# Patient Record
Sex: Male | Born: 1960
Health system: Southern US, Community
[De-identification: ages and names within clinical notes are randomized; demographics above are authoritative.]

## PROBLEM LIST (undated history)

## (undated) DIAGNOSIS — I5032 Chronic diastolic (congestive) heart failure: Secondary | ICD-10-CM

## (undated) DIAGNOSIS — J9610 Chronic respiratory failure, unspecified whether with hypoxia or hypercapnia: Secondary | ICD-10-CM

## (undated) DIAGNOSIS — G4733 Obstructive sleep apnea (adult) (pediatric): Secondary | ICD-10-CM

## (undated) DIAGNOSIS — J1100 Influenza due to unidentified influenza virus with unspecified type of pneumonia: Secondary | ICD-10-CM

## (undated) DIAGNOSIS — I10 Essential (primary) hypertension: Secondary | ICD-10-CM

## (undated) DIAGNOSIS — E119 Type 2 diabetes mellitus without complications: Secondary | ICD-10-CM

## (undated) DIAGNOSIS — E785 Hyperlipidemia, unspecified: Secondary | ICD-10-CM

## (undated) DIAGNOSIS — M419 Scoliosis, unspecified: Secondary | ICD-10-CM

## (undated) HISTORY — PX: OTHER SURGICAL HISTORY: SHX169

## (undated) HISTORY — DX: Type 2 diabetes mellitus without complications: E11.9

## (undated) HISTORY — DX: Chronic diastolic (congestive) heart failure: I50.32

## (undated) HISTORY — DX: Hyperlipidemia, unspecified: E78.5

---

## 2014-11-08 DIAGNOSIS — J1100 Influenza due to unidentified influenza virus with unspecified type of pneumonia: Secondary | ICD-10-CM

## 2014-11-08 HISTORY — DX: Influenza due to unidentified influenza virus with unspecified type of pneumonia: J11.00

## 2015-01-23 DIAGNOSIS — R0902 Hypoxemia: Secondary | ICD-10-CM | POA: Insufficient documentation

## 2015-01-23 DIAGNOSIS — J811 Chronic pulmonary edema: Secondary | ICD-10-CM | POA: Insufficient documentation

## 2015-01-28 DIAGNOSIS — R609 Edema, unspecified: Secondary | ICD-10-CM | POA: Insufficient documentation

## 2015-01-28 DIAGNOSIS — I119 Hypertensive heart disease without heart failure: Secondary | ICD-10-CM | POA: Insufficient documentation

## 2015-09-08 ENCOUNTER — Encounter (HOSPITAL_BASED_OUTPATIENT_CLINIC_OR_DEPARTMENT_OTHER): Payer: Self-pay | Admitting: Emergency Medicine

## 2015-09-08 ENCOUNTER — Inpatient Hospital Stay (HOSPITAL_BASED_OUTPATIENT_CLINIC_OR_DEPARTMENT_OTHER)
Admission: EM | Admit: 2015-09-08 | Discharge: 2015-09-14 | DRG: 208 | Disposition: A | Discharge status: Home / Self Care | Payer: Medicaid Other | Attending: Internal Medicine | Admitting: Internal Medicine

## 2015-09-08 ENCOUNTER — Emergency Department (HOSPITAL_BASED_OUTPATIENT_CLINIC_OR_DEPARTMENT_OTHER): Payer: Medicaid Other

## 2015-09-08 DIAGNOSIS — J9621 Acute and chronic respiratory failure with hypoxia: Secondary | ICD-10-CM | POA: Diagnosis present

## 2015-09-08 DIAGNOSIS — G9340 Encephalopathy, unspecified: Secondary | ICD-10-CM | POA: Diagnosis present

## 2015-09-08 DIAGNOSIS — I2781 Cor pulmonale (chronic): Secondary | ICD-10-CM | POA: Diagnosis present

## 2015-09-08 DIAGNOSIS — G9341 Metabolic encephalopathy: Secondary | ICD-10-CM | POA: Diagnosis present

## 2015-09-08 DIAGNOSIS — J9622 Acute and chronic respiratory failure with hypercapnia: Secondary | ICD-10-CM | POA: Diagnosis present

## 2015-09-08 DIAGNOSIS — J189 Pneumonia, unspecified organism: Secondary | ICD-10-CM | POA: Diagnosis present

## 2015-09-08 DIAGNOSIS — Z6841 Body Mass Index (BMI) 40.0 and over, adult: Secondary | ICD-10-CM | POA: Diagnosis not present

## 2015-09-08 DIAGNOSIS — R739 Hyperglycemia, unspecified: Secondary | ICD-10-CM | POA: Diagnosis present

## 2015-09-08 DIAGNOSIS — J9811 Atelectasis: Secondary | ICD-10-CM | POA: Diagnosis present

## 2015-09-08 DIAGNOSIS — E876 Hypokalemia: Secondary | ICD-10-CM | POA: Diagnosis present

## 2015-09-08 DIAGNOSIS — J811 Chronic pulmonary edema: Secondary | ICD-10-CM | POA: Diagnosis present

## 2015-09-08 DIAGNOSIS — N179 Acute kidney failure, unspecified: Secondary | ICD-10-CM | POA: Diagnosis present

## 2015-09-08 DIAGNOSIS — I110 Hypertensive heart disease with heart failure: Secondary | ICD-10-CM | POA: Diagnosis present

## 2015-09-08 DIAGNOSIS — E8770 Fluid overload, unspecified: Secondary | ICD-10-CM | POA: Diagnosis present

## 2015-09-08 DIAGNOSIS — E662 Morbid (severe) obesity with alveolar hypoventilation: Secondary | ICD-10-CM | POA: Diagnosis present

## 2015-09-08 DIAGNOSIS — J9601 Acute respiratory failure with hypoxia: Secondary | ICD-10-CM | POA: Diagnosis present

## 2015-09-08 DIAGNOSIS — R0602 Shortness of breath: Secondary | ICD-10-CM

## 2015-09-08 DIAGNOSIS — J9602 Acute respiratory failure with hypercapnia: Secondary | ICD-10-CM

## 2015-09-08 DIAGNOSIS — J441 Chronic obstructive pulmonary disease with (acute) exacerbation: Secondary | ICD-10-CM | POA: Diagnosis present

## 2015-09-08 DIAGNOSIS — I5033 Acute on chronic diastolic (congestive) heart failure: Secondary | ICD-10-CM | POA: Diagnosis present

## 2015-09-08 DIAGNOSIS — I509 Heart failure, unspecified: Secondary | ICD-10-CM

## 2015-09-08 DIAGNOSIS — J9600 Acute respiratory failure, unspecified whether with hypoxia or hypercapnia: Secondary | ICD-10-CM | POA: Diagnosis present

## 2015-09-08 DIAGNOSIS — J9690 Respiratory failure, unspecified, unspecified whether with hypoxia or hypercapnia: Secondary | ICD-10-CM

## 2015-09-08 HISTORY — DX: Essential (primary) hypertension: I10

## 2015-09-08 LAB — COMPREHENSIVE METABOLIC PANEL
ALBUMIN: 3.5 g/dL (ref 3.5–5.0)
ALK PHOS: 65 U/L (ref 38–126)
ALT: 153 U/L — ABNORMAL HIGH (ref 17–63)
AST: 73 U/L — AB (ref 15–41)
Anion gap: 5 (ref 5–15)
BILIRUBIN TOTAL: 0.6 mg/dL (ref 0.3–1.2)
BUN: 21 mg/dL — AB (ref 6–20)
CALCIUM: 8.6 mg/dL — AB (ref 8.9–10.3)
CO2: 36 mmol/L — ABNORMAL HIGH (ref 22–32)
Chloride: 101 mmol/L (ref 101–111)
Creatinine, Ser: 1.08 mg/dL (ref 0.61–1.24)
GFR calc Af Amer: >60 mL/min (ref >60)
GFR calc non Af Amer: >60 mL/min (ref >60)
GLUCOSE: 185 mg/dL — AB (ref 65–99)
Potassium: 3.8 mmol/L (ref 3.5–5.1)
Sodium: 142 mmol/L (ref 135–145)
TOTAL PROTEIN: 6.6 g/dL (ref 6.5–8.1)

## 2015-09-08 LAB — CBC WITH DIFFERENTIAL/PLATELET
BASOS ABS: 0 10*3/uL (ref 0.0–0.1)
BASOS PCT: 0 %
EOS PCT: 1 %
Eosinophils Absolute: 0 10*3/uL (ref 0.0–0.7)
HEMATOCRIT: 50.3 % (ref 39.0–52.0)
Hemoglobin: 15.6 g/dL (ref 13.0–17.0)
LYMPHS ABS: 1.4 10*3/uL (ref 0.7–4.0)
LYMPHS PCT: 17 %
MCH: 30.9 pg (ref 26.0–34.0)
MCHC: 31 g/dL (ref 30.0–36.0)
MCV: 99.6 fL (ref 78.0–100.0)
MONO ABS: 0.6 10*3/uL (ref 0.1–1.0)
MONOS PCT: 8 %
Neutro Abs: 5.9 10*3/uL (ref 1.7–7.7)
Neutrophils Relative %: 75 %
PLATELETS: 278 10*3/uL (ref 150–400)
RBC: 5.05 MIL/uL (ref 4.22–5.81)
RDW: 15.5 % (ref 11.5–15.5)
WBC: 7.9 10*3/uL (ref 4.0–10.5)

## 2015-09-08 LAB — PROTIME-INR
INR: 0.97 (ref 0.00–1.49)
Prothrombin Time: 13.1 seconds (ref 11.6–15.2)

## 2015-09-08 LAB — I-STAT ARTERIAL BLOOD GAS, ED
ACID-BASE EXCESS: 5 mmol/L — AB (ref 0.0–2.0)
Acid-Base Excess: 5 mmol/L — ABNORMAL HIGH (ref 0.0–2.0)
Acid-Base Excess: 9 mmol/L — ABNORMAL HIGH (ref 0.0–2.0)
BICARBONATE: 36.2 meq/L — AB (ref 20.0–24.0)
Bicarbonate: 37.9 mEq/L — ABNORMAL HIGH (ref 20.0–24.0)
Bicarbonate: 33.9 mEq/L — ABNORMAL HIGH (ref 20.0–24.0)
O2 SAT: 91 %
O2 Saturation: 84 %
O2 Saturation: 100 %
PCO2 ART: 97.8 mmHg — AB (ref 35.0–45.0)
PCO2 ART: 47.2 mmHg — AB (ref 35.0–45.0)
PH ART: 7.229 — AB (ref 7.350–7.450)
PH ART: 7.197 — AB (ref 7.350–7.450)
PO2 ART: 79 mmHg — AB (ref 80.0–100.0)
PO2 ART: 217 mmHg — AB (ref 80.0–100.0)
Patient temperature: 98.7
Patient temperature: 36.8
TCO2: 39 mmol/L (ref 0–100)
TCO2: 41 mmol/L (ref 0–100)
TCO2: 35 mmol/L (ref 0–100)
pCO2 arterial: 86.6 mmHg (ref 35.0–45.0)
pH, Arterial: 7.464 — ABNORMAL HIGH (ref 7.350–7.450)
pO2, Arterial: 60 mmHg — ABNORMAL LOW (ref 80.0–100.0)

## 2015-09-08 LAB — URINALYSIS, ROUTINE W REFLEX MICROSCOPIC
Bilirubin Urine: NEGATIVE
Glucose, UA: NEGATIVE mg/dL
Hgb urine dipstick: NEGATIVE
KETONES UR: NEGATIVE mg/dL
LEUKOCYTES UA: NEGATIVE
Nitrite: NEGATIVE
PROTEIN: 30 mg/dL — AB
Specific Gravity, Urine: 1.031 — ABNORMAL HIGH (ref 1.005–1.030)
UROBILINOGEN UA: 1 mg/dL (ref 0.0–1.0)
pH: 6 (ref 5.0–8.0)

## 2015-09-08 LAB — LIPASE, BLOOD: Lipase: 26 U/L (ref 11–51)

## 2015-09-08 LAB — TROPONIN I
TROPONIN I: 0.06 ng/mL — AB (ref <0.031)
Troponin I: 0.08 ng/mL — ABNORMAL HIGH (ref <0.031)

## 2015-09-08 LAB — URINE MICROSCOPIC-ADD ON

## 2015-09-08 LAB — PROCALCITONIN: Procalcitonin: 0.1 ng/mL

## 2015-09-08 LAB — I-STAT CG4 LACTIC ACID, ED: LACTIC ACID, VENOUS: 1.46 mmol/L (ref 0.5–2.0)

## 2015-09-08 LAB — BRAIN NATRIURETIC PEPTIDE: B Natriuretic Peptide: 130.3 pg/mL — ABNORMAL HIGH (ref 0.0–100.0)

## 2015-09-08 LAB — GLUCOSE, CAPILLARY: Glucose-Capillary: 164 mg/dL — ABNORMAL HIGH (ref 65–99)

## 2015-09-08 LAB — MRSA PCR SCREENING: MRSA BY PCR: NEGATIVE

## 2015-09-08 MED ORDER — ETOMIDATE 2 MG/ML IV SOLN
20.0000 mg | Freq: Once | INTRAVENOUS | Status: AC
  Administered 2015-09-08: 20 mg via INTRAVENOUS
  Filled 2015-09-08: qty 10

## 2015-09-08 MED ORDER — SODIUM CHLORIDE 0.9 % IV SOLN: 250.0000 mL | INTRAVENOUS | Status: DC | PRN

## 2015-09-08 MED ORDER — MIDAZOLAM HCL 10 MG/2ML IJ SOLN
INTRAMUSCULAR | Status: AC
  Administered 2015-09-08: 10 mg via INTRAVENOUS
  Filled 2015-09-08: qty 2

## 2015-09-08 MED ORDER — SODIUM CHLORIDE 0.9 % IV SOLN
INTRAVENOUS | Status: DC
  Administered 2015-09-08: 500 mL via INTRAVENOUS

## 2015-09-08 MED ORDER — HYDRALAZINE HCL 20 MG/ML IJ SOLN
10.0000 mg | INTRAMUSCULAR | Status: DC | PRN
Start: 2015-09-08 — End: 2015-09-14

## 2015-09-08 MED ORDER — SODIUM CHLORIDE 0.9 % IV SOLN: INTRAVENOUS | Status: DC

## 2015-09-08 MED ORDER — AZITHROMYCIN 500 MG IV SOLR
INTRAVENOUS | Status: AC
  Filled 2015-09-08: qty 500

## 2015-09-08 MED ORDER — PROPOFOL 1000 MG/100ML IV EMUL
5.0000 ug/kg/min | INTRAVENOUS | Status: DC
  Administered 2015-09-08 – 2015-09-09 (×12): 60 ug/kg/min via INTRAVENOUS
  Administered 2015-09-09: 30 ug/kg/min via INTRAVENOUS
  Administered 2015-09-10 (×4): 60 ug/kg/min via INTRAVENOUS
  Filled 2015-09-08: qty 100
  Filled 2015-09-08: qty 200
  Filled 2015-09-08 (×4): qty 100
  Filled 2015-09-08 (×2): qty 200
  Filled 2015-09-08: qty 100
  Filled 2015-09-08 (×2): qty 200
  Filled 2015-09-08 (×4): qty 100

## 2015-09-08 MED ORDER — INFLUENZA VAC SPLIT QUAD 0.5 ML IM SUSY
0.5000 mL | PREFILLED_SYRINGE | INTRAMUSCULAR | Status: AC
  Administered 2015-09-10: 0.5 mL via INTRAMUSCULAR
  Filled 2015-09-08 (×3): qty 0.5

## 2015-09-08 MED ORDER — IPRATROPIUM-ALBUTEROL 0.5-2.5 (3) MG/3ML IN SOLN
3.0000 mL | Freq: Once | RESPIRATORY_TRACT | Status: AC
  Administered 2015-09-08: 3 mL via RESPIRATORY_TRACT
  Filled 2015-09-08: qty 3

## 2015-09-08 MED ORDER — ALBUTEROL SULFATE (2.5 MG/3ML) 0.083% IN NEBU: 2.5000 mg | INHALATION_SOLUTION | RESPIRATORY_TRACT | Status: DC | PRN

## 2015-09-08 MED ORDER — ENOXAPARIN SODIUM 40 MG/0.4ML ~~LOC~~ SOLN
40.0000 mg | SUBCUTANEOUS | Status: DC
  Administered 2015-09-08: 40 mg via SUBCUTANEOUS
  Filled 2015-09-08 (×2): qty 0.4

## 2015-09-08 MED ORDER — FAMOTIDINE IN NACL 20-0.9 MG/50ML-% IV SOLN
20.0000 mg | Freq: Two times a day (BID) | INTRAVENOUS | Status: DC
  Administered 2015-09-08 – 2015-09-09 (×3): 20 mg via INTRAVENOUS
  Filled 2015-09-08 (×4): qty 50

## 2015-09-08 MED ORDER — ALBUTEROL SULFATE (2.5 MG/3ML) 0.083% IN NEBU
2.5000 mg | INHALATION_SOLUTION | Freq: Once | RESPIRATORY_TRACT | Status: AC
  Administered 2015-09-08: 2.5 mg via RESPIRATORY_TRACT
  Filled 2015-09-08: qty 3

## 2015-09-08 MED ORDER — MIDAZOLAM HCL 10 MG/2ML IJ SOLN
INTRAMUSCULAR | Status: AC
  Filled 2015-09-08: qty 2

## 2015-09-08 MED ORDER — METHYLPREDNISOLONE SODIUM SUCC 125 MG IJ SOLR
125.0000 mg | Freq: Once | INTRAMUSCULAR | Status: AC
  Administered 2015-09-08: 125 mg via INTRAVENOUS
  Filled 2015-09-08: qty 2

## 2015-09-08 MED ORDER — ANTISEPTIC ORAL RINSE SOLUTION (CORINZ)
7.0000 mL | Freq: Four times a day (QID) | OROMUCOSAL | Status: DC
  Administered 2015-09-08 – 2015-09-12 (×14): 7 mL via OROMUCOSAL

## 2015-09-08 MED ORDER — FUROSEMIDE 10 MG/ML IJ SOLN: 40.0000 mg | Freq: Once | INTRAMUSCULAR | Status: DC

## 2015-09-08 MED ORDER — DEXTROSE 5 % IV SOLN
1.0000 g | Freq: Once | INTRAVENOUS | Status: AC
  Administered 2015-09-08: 1 g via INTRAVENOUS

## 2015-09-08 MED ORDER — MIDAZOLAM HCL 5 MG/5ML IJ SOLN
5.0000 mg | Freq: Once | INTRAMUSCULAR | Status: AC
  Administered 2015-09-08: 5 mg via INTRAVENOUS
  Filled 2015-09-08: qty 5

## 2015-09-08 MED ORDER — IPRATROPIUM-ALBUTEROL 0.5-2.5 (3) MG/3ML IN SOLN
3.0000 mL | RESPIRATORY_TRACT | Status: DC
  Administered 2015-09-08: 3 mL via RESPIRATORY_TRACT
  Filled 2015-09-08: qty 3

## 2015-09-08 MED ORDER — FUROSEMIDE 10 MG/ML IJ SOLN
40.0000 mg | Freq: Two times a day (BID) | INTRAMUSCULAR | Status: DC
  Administered 2015-09-08 – 2015-09-10 (×4): 40 mg via INTRAVENOUS
  Filled 2015-09-08 (×7): qty 4

## 2015-09-08 MED ORDER — IOHEXOL 350 MG/ML SOLN
100.0000 mL | Freq: Once | INTRAVENOUS | Status: AC | PRN
  Administered 2015-09-08: 100 mL via INTRAVENOUS

## 2015-09-08 MED ORDER — DEXTROSE 5 % IV SOLN
500.0000 mg | Freq: Once | INTRAVENOUS | Status: AC
  Administered 2015-09-08: 500 mg via INTRAVENOUS

## 2015-09-08 MED ORDER — CHLORHEXIDINE GLUCONATE 0.12% ORAL RINSE (MEDLINE KIT)
15.0000 mL | Freq: Two times a day (BID) | OROMUCOSAL | Status: DC
  Administered 2015-09-08 – 2015-09-12 (×8): 15 mL via OROMUCOSAL

## 2015-09-08 MED ORDER — SODIUM CHLORIDE 0.9 % IV BOLUS (SEPSIS)
500.0000 mL | Freq: Once | INTRAVENOUS | Status: AC
  Administered 2015-09-08: 500 mL via INTRAVENOUS

## 2015-09-08 MED ORDER — PNEUMOCOCCAL VAC POLYVALENT 25 MCG/0.5ML IJ INJ
0.5000 mL | INJECTION | INTRAMUSCULAR | Status: AC
  Administered 2015-09-10: 0.5 mL via INTRAMUSCULAR
  Filled 2015-09-08 (×2): qty 0.5

## 2015-09-08 MED ORDER — MIDAZOLAM HCL 10 MG/2ML IJ SOLN
10.0000 mg | Freq: Once | INTRAMUSCULAR | Status: AC
  Administered 2015-09-08: 10 mg via INTRAVENOUS

## 2015-09-08 MED ORDER — CEFTRIAXONE SODIUM 1 G IJ SOLR
INTRAMUSCULAR | Status: AC
  Filled 2015-09-08: qty 10

## 2015-09-08 MED ORDER — SUCCINYLCHOLINE CHLORIDE 20 MG/ML IJ SOLN
120.0000 mg | Freq: Once | INTRAMUSCULAR | Status: AC
  Administered 2015-09-08: 120 mg via INTRAVENOUS
  Filled 2015-09-08: qty 1

## 2015-09-08 MED ORDER — SODIUM CHLORIDE 0.9 % IV SOLN
INTRAVENOUS | Status: DC
  Administered 2015-09-09: 07:00:00 via INTRAVENOUS
  Administered 2015-09-11: 10 mL/h via INTRAVENOUS

## 2015-09-08 NOTE — ED Notes (Signed)
Pt states he has been sob with peripheral edema for two weeks.  Pt states he has been having intermittent chest pain with it.  Pt has had intermittent cough.  Occasional cough, gray to yellow sputum.  Pt admits to fever yesterday but did not take it.  Pt has sob on exertion.

## 2015-09-08 NOTE — H&P (Signed)
PULMONARY / CRITICAL CARE MEDICINE   Name: Daschel Krasowski MRN: [id number] DOB: 05/24/1961    ADMISSION DATE:  09/08/2015 CONSULTATION DATE:  09/08/2015  REFERRING MD :  [facility] [place] [facility]  CHIEF COMPLAINT:  Respiratory failure  INITIAL PRESENTATION: 54 year old male who presented to [facility] [place] complaining of lower extremity edema and shortness of breath for several weeks. Chest x-ray showed bibasilar opacification. His respiratory status declined and he required intubation. He was transferred to [facility] for ICU admission.  STUDIES:  CTA chest 10/31> no evidence of PE. Atelectasis versus pneumonia bibasilar.  SIGNIFICANT EVENTS: 10/31 admit   HISTORY OF PRESENT ILLNESS:  54 year old male with past medical history of hypertension presented to [facility] [place] [facility] 09/08/2015. Of note he was recently imprisoned, and shortly after he was released (about 6 months ago) he was admitted for pneumonia. He presented complaining of shortness of breath 2 weeks bilateral lower strength any edema 4 weeks. He reports cough, productive for gray/yellow sputum. Subjective fevers yesterday. 10/29 he became short of breath work and was too short of breath to continue. In emergency department he was noted to have moderately increased work of breathing, tachycardia, and +3 bilateral lower shotty pitting edema. He was slightly somnolent initially over the somewhat worsened. CT angiogram of the chest was obtained in order to rule out pulmonary embolism, no PE was identified. However bibasilar atelectasis versus pneumonia was identified and confirmed findings of chest x-ray. In the emergency department he was treated as a COPD exacerbation, and CAP. Unfortunately eventually required intubation due to worsening arterial blood gas sample. He was transferred to [facility] for ICU admission.  PAST MEDICAL HISTORY :   has a past medical history of Hypertension.  has no past surgical history on  file. Prior to Admission medications   Not on File   No Known Allergies  FAMILY HISTORY:  has no family status information on file.  SOCIAL HISTORY:  reports that he has never smoked. He does not have any smokeless tobacco history on file.  REVIEW OF SYSTEMS:  Unable, patient is encephalopathic and intubated.   SUBJECTIVE:   VITAL SIGNS: Temp:  [95.4 F (35.2 C)-98.6 F (37 C)] 98.2 F (36.8 C) (10/31 1406) Pulse Rate:  [77-136] 90 (10/31 1406) Resp:  [12-18] 12 (10/31 1255) BP: (138-180)/(91-114) 161/114 mmHg (10/31 1406) SpO2:  [46 %-100 %] 98 % (10/31 1406) FiO2 (%):  [30 %-100 %] 60 % (10/31 1419) Weight:  [120.203 kg (265 lb)] 120.203 kg (265 lb) (10/31 1135) HEMODYNAMICS:   VENTILATOR SETTINGS: Vent Mode:  [-] PRVC FiO2 (%):  [30 %-100 %] 60 % Set Rate:  [18 bmp] 18 bmp Vt Set:  [600 mL] 600 mL PEEP:  [5 cmH20] 5 cmH20 Plateau Pressure:  [24 cmH20] 24 cmH20 INTAKE / OUTPUT:  Intake/Output Summary (Last 24 hours) at 09/08/15 1504 Last data filed at 09/08/15 1412  Gross per 24 hour  Intake    900 ml  Output    300 ml  Net    600 ml    PHYSICAL EXAMINATION: General:  Morbidly obese male on ventilator Neuro:  Sedated on vent RASS -2 HEENT:  [place]/AT, no appreciable JVD due to neck girth, PERRL Cardiovascular:  RRR, no MRG Lungs:  Coarse bilateral breath sounds Abdomen:  Obese, soft, non-distended Musculoskeletal:  No acute deformity Skin:  Grossly intact  LABS:  CBC  Recent Labs Lab 09/08/15 0945  WBC 7.9  HGB 15.6  HCT 50.3  PLT  278   Coag's  Recent Labs Lab 09/08/15 0945  INR 0.97   BMET  Recent Labs Lab 09/08/15 0945  NA 142  K 3.8  CL 101  CO2 36*  BUN 21*  CREATININE 1.08  GLUCOSE 185*   Electrolytes  Recent Labs Lab 09/08/15 0945  CALCIUM 8.6*   Sepsis Markers  Recent Labs Lab 09/08/15 0944 09/08/15 1021  LATICACIDVEN <0.30* 1.46   ABG  Recent Labs Lab 09/08/15 0949 09/08/15 1206 09/08/15 1409  PHART  7.229* 7.197* 7.464*  PCO2ART 86.6* 97.8* 47.2*  PO2ART 60.0* 79.0* 217.0*   Liver Enzymes  Recent Labs Lab 09/08/15 0945  AST 73*  ALT 153*  ALKPHOS 65  BILITOT 0.6  ALBUMIN 3.5   Cardiac Enzymes  Recent Labs Lab 09/08/15 0945  TROPONINI 0.08*   Glucose No results for input(s): GLUCAP in the last 168 hours.  Imaging Ct Angio Chest Pe W/cm &/or Wo Cm  09/08/2015  CLINICAL DATA:  Shortness of breath, cough, extremity edema, hypoxia and chest pain. EXAM: CT ANGIOGRAPHY CHEST WITH CONTRAST TECHNIQUE: Multidetector CT imaging of the chest was performed using the standard protocol during bolus administration of intravenous contrast. Multiplanar CT image reconstructions and MIPs were obtained to evaluate the vascular anatomy. CONTRAST:  100mL OMNIPAQUE IOHEXOL 350 MG/ML SOLN COMPARISON:  Chest x-ray earlier today. FINDINGS: The pulmonary arteries are adequately opacified. There is no evidence of pulmonary embolism. The thoracic aorta is normal in caliber and shows no evidence of aneurysmal disease or dissection. The heart size is at the upper limits of normal. There is suggestion of potential subtle calcified plaque in the distribution of the LAD. No pleural or pericardial fluid identified. Both posterior lower lobes demonstrates streaky opacities likely consistent with atelectasis. Component of pneumonia would be difficult to exclude. No evidence of pulmonary edema, nodule, airway obstruction or pneumothorax. No enlarged lymph nodes are seen. Visualized upper abdominal structures are unremarkable. Bony structures show a severe leftward convex scoliosis of the upper thoracic spine and compensatory rightward curvature in the lower thoracic and upper lumbar spine. Review of the MIP images confirms the above findings. IMPRESSION: 1. No evidence of pulmonary embolism. 2. Prominent atelectasis in both lower lobes. Component of pneumonia would be difficult to exclude. 3. Suggestion of subtle  calcified plaque in the distribution of the left anterior descending coronary artery. 4. Severe scoliosis. Electronically Signed   By: Irish LackGlenn  Yamagata M.D.   On: 09/08/2015 12:03   Dg Chest Port 1 View  09/08/2015  CLINICAL DATA:  Post intubation EXAM: PORTABLE CHEST 1 VIEW COMPARISON:  CT of the chest same day FINDINGS: Cardiomegaly. Dextroscoliosis lower thoracic and upper lumbar spine. Central vascular congestion without pulmonary edema. Bilateral basilar atelectasis or infiltrate. Endotracheal tube in place with tip 3. 5 cm above the carina. No pneumothorax. IMPRESSION: Endotracheal tube in place. No pneumothorax. Bilateral basilar atelectasis or infiltrate. Electronically Signed   By: Natasha MeadLiviu  Pop M.D.   On: 09/08/2015 13:27   Dg Chest Port 1 View  09/08/2015  CLINICAL DATA:  Cough and shortness of breath, 2-3 weeks duration. EXAM: PORTABLE CHEST 1 VIEW COMPARISON:  None. FINDINGS: There is thoracic deformity related to spinal curvature. Heart size is normal. Mediastinal shadows are normal allowing for the spinal curvature. There is patchy infiltrate in both lung bases, left worse than right, consistent with basilar pneumonia. No dense consolidation or lobar collapse. No effusion. IMPRESSION: Thoracic deformity related to spinal curvature. Basilar pneumonia left more than right. Electronically Signed  By: Paulina Fusi M.D.   On: 09/08/2015 10:15     ASSESSMENT / PLAN:  PULMONARY OETT 10/31 >>> A: Acute on chronic hypercarbic/hypoxemic respiratory failure Suspect decompensated OHS ?Pulmonary edema  P:   Full vent support Diurese as tolerated Repeat ABG  VAP bundle SBT in AM PRN nebs  CARDIOVASCULAR A:  HTN > has been unable to get antiHTN meds for some time.  P:  Telemetry monitoring PRN hydralazine Echo BNP Lasix as tolerated. Will start  BID Strict I/O  RENAL A:   Hypervolemia   P:   KVO IVF Diurese as above Follow Bmet Correct electrolytes as  indicated  GASTROINTESTINAL A:   No acute issues  P:   NPO Pepcid for SUP  HEMATOLOGIC A:   VTE ppx  P:  Enoxaparin for VTE ppx Follow CBC  INFECTIOUS A:   ? CAP > doubt in absence of leukocytosis, fevers. Volume overloaded on exam.   P:   BCx2 10/31 >>> Monitor off ABX Follow WBC and fever curve  ENDOCRINE A:   Hyperglycemia without history of DM  P:   CBG monitoring and SSI  NEUROLOGIC A:   Acute metabolic encephalopathy in setting hypercarbia/hypoxemia.   P:   RASS goal: -1 Propofol infusion titrated to RASS goal WUA in AM   FAMILY  - Updates: Spoke with sister Will on 10/31 PM  - Inter-disciplinary family meet or Palliative Care meeting due by:  11/6  APP Critical Care time: 45 mins  Joneen Roach, AGACNP-BC [facility] Pager [phone] or [phone]  09/08/2015 3:57 PM  Attending:  I have seen and examined the patient with nurse practitioner/resident and agree with the note above.   We were unable to get a history as Mr. Apps was intubated in [facility] On my exam he is sedated on the vent, he has scant crackles in the lower lobes and he has vent supported breaths Legs with significant edema bilaterally, CV exam: tachy, regular, distant heart sounds, cannot appreciate JVD  CT images personally reviewed> there is mostly atelectasis in the lower lobes, no frank infiltrate  Acute on chronic hypercapnic respiratory failure> suspect that this is decompensated cor pulmonale in the setting of obesity hypoventilation syndrome and decompensated CHF (EF unknown).  Do not think he has pneumonia as no fever, no leukocytosis, no respiratory secretions. Plan full vent support Diurese Echo Hold antibiotics Hopefully extubate tomorrow  My cc time 40 minutes  Heber Gratz, MD [facility] [facility] Pager: [phone] Cell: [phone] After 3pm or if no response, call [phone]

## 2015-09-08 NOTE — ED Notes (Signed)
Rt called to room to asses pt. Pt labored SpO2 found to be 44%.  Pt alert. MD called to room .

## 2015-09-08 NOTE — ED Provider Notes (Signed)
CSN: [id number]     Arrival date & time 09/08/15  0908 History   First MD Initiated Contact with Patient 09/08/15 0919     Chief Complaint  Patient presents with  . Shortness of Breath     (Consider location/radiation/quality/duration/timing/severity/associated sxs/prior Treatment) HPI Patient reports that he has been developing shortness of breath for about 2 weeks. He notes swelling of his lower extremities for about 4 weeks. He states now he has been getting chest pain over the past couple of days that is in his upper and central chest. He reports he has had some coughing with it and there has been a gray or yellow sputum associated. He believes he had a fever yesterday but he does not know how high. He reports as of 2 days ago he became too short of breath on exertion to continue his job. The patient reports that he did have an episode whereby he was hospitalized and diagnosed with pneumonia just after he got out of prison. He states that was over 6 months ago. He denies knowledge of specific heart disease or lung disease. He reports he is supposed to be on blood pressure medications but he has been out of them for several months. He denies tobacco use with no smoking history. He denies any alcohol or drug use history. Past Medical History  Diagnosis Date  . Hypertension    History reviewed. No pertinent past surgical history. History reviewed. No pertinent family history. Social History  Substance Use Topics  . Smoking status: Never Smoker   . Smokeless tobacco: None  . Alcohol Use: None    Review of Systems  10 Systems reviewed and are negative for acute change except as noted in the HPI.   Allergies  Review of patient's allergies indicates no known allergies.  Home Medications   Prior to Admission medications   Not on File   BP 133/78 mmHg  Pulse 63  Temp(Src) 97.5 F (36.4 C) (Core (Comment))  Resp 18  Ht 5\' 9"  (1.753 m)  Wt 282 lb 13.6 oz (128.3 kg)  BMI 41.75  kg/m2  SpO2 96% Physical Exam  Constitutional:  Patient has moderate increased work of breathing. He is speaking in full short sentences. Patient has central obesity.  HENT:  Head: Normocephalic and atraumatic.  Mouth/Throat: Oropharynx is clear and moist.  Eyes: EOM are normal. Pupils are equal, round, and reactive to light.  Neck: Neck supple.  Cardiovascular:  Patient has borderline tachycardia. There is sinus rhythm on the monitor 105. I do not appreciate any significant murmur, gallop or rub.  Pulmonary/Chest:  Moderate increased work of breathing. Poor air flow to posterior auscultation. Occasional fine wheeze.  Abdominal:  Abdomen is firm and distended. Patient does not endorse tenderness.  Musculoskeletal:  Bilateral lower extremities 3+ pitting edema. Symmetric. No open wounds or ulcerations. Significant oncymycosis. Thinning of the skin of the lower legs suggestive of chronic venous stasis.  Neurological:  Patient is slightly somnolent but is answering all questions appropriately. He follows commands appropriately. No localizing motor dysfunction.  Skin: Skin is warm and dry.    ED Course  .Intubation Date/Time: 09/09/2015 8:24 AM Performed by: Arby BarrettePFEIFFER, Ailee Pates Authorized by: Arby BarrettePFEIFFER, Kennon Encinas Consent: Verbal consent obtained. Consent given by: patient Indications: respiratory failure Intubation method: video-assisted Patient status: paralyzed (RSI) Preoxygenation: BVM Sedatives: etomidate Paralytic: succinylcholine Laryngoscope size: Mac 4 Tube size: 7.5 mm Tube type: cuffed Number of attempts: 1 Post-procedure assessment: CO2 detector and chest rise Breath sounds: equal Cuff  inflated: yes Chest x-ray interpreted by radiologist. Chest x-ray findings: endotracheal tube in appropriate position Patient tolerance: Patient tolerated the procedure well with no immediate complications Comments: Uncomplicated intubation with first attempt. No desaturation during  intubation.   (including critical care time) CRITICAL CARE Performed by: Arby Barrette   Total critical care time: 60 minutes  Critical care time was exclusive of separately billable procedures and treating other patients.  Critical care was necessary to treat or prevent imminent or life-threatening deterioration.  Critical care was time spent personally by me on the following activities: development of treatment plan with patient and/or surrogate as well as nursing, discussions with consultants, evaluation of patient's response to treatment, examination of patient, obtaining history from patient or surrogate, ordering and performing treatments and interventions, ordering and review of laboratory studies, ordering and review of radiographic studies, pulse oximetry and re-evaluation of patient's condition. Labs Review Labs Reviewed  COMPREHENSIVE METABOLIC PANEL - Abnormal; Notable for the following:    CO2 36 (*)    Glucose, Bld 185 (*)    BUN 21 (*)    Calcium 8.6 (*)    AST 73 (*)    ALT 153 (*)    All other components within normal limits  BRAIN NATRIURETIC PEPTIDE - Abnormal; Notable for the following:    B Natriuretic Peptide 130.3 (*)    All other components within normal limits  TROPONIN I - Abnormal; Notable for the following:    Troponin I 0.08 (*)    All other components within normal limits  URINALYSIS, ROUTINE W REFLEX MICROSCOPIC (NOT AT [facility]) - Abnormal; Notable for the following:    Specific Gravity, Urine 1.031 (*)    Protein, ur 30 (*)    All other components within normal limits  URINE MICROSCOPIC-ADD ON - Abnormal; Notable for the following:    Squamous Epithelial / LPF FEW (*)    Bacteria, UA FEW (*)    All other components within normal limits  TROPONIN I - Abnormal; Notable for the following:    Troponin I 0.06 (*)    All other components within normal limits  GLUCOSE, CAPILLARY - Abnormal; Notable for the following:    Glucose-Capillary 164 (*)     All other components within normal limits  BASIC METABOLIC PANEL - Abnormal; Notable for the following:    Potassium 3.3 (*)    Glucose, Bld 158 (*)    Calcium 8.5 (*)    All other components within normal limits  BLOOD GAS, ARTERIAL - Abnormal; Notable for the following:    pH, Arterial 7.547 (*)    pO2, Arterial 61.7 (*)    Bicarbonate 32.4 (*)    Acid-Base Excess 9.1 (*)    All other components within normal limits  I-STAT ARTERIAL BLOOD GAS, ED - Abnormal; Notable for the following:    pH, Arterial 7.229 (*)    pCO2 arterial 86.6 (*)    pO2, Arterial 60.0 (*)    Bicarbonate 36.2 (*)    Acid-Base Excess 5.0 (*)    All other components within normal limits  I-STAT CG4 LACTIC ACID, ED - Abnormal; Notable for the following:    Lactic Acid, Venous <0.30 (*)    All other components within normal limits  I-STAT ARTERIAL BLOOD GAS, ED - Abnormal; Notable for the following:    pH, Arterial 7.197 (*)    pCO2 arterial 97.8 (*)    pO2, Arterial 79.0 (*)    Bicarbonate 37.9 (*)    Acid-Base Excess  5.0 (*)    All other components within normal limits  I-STAT ARTERIAL BLOOD GAS, ED - Abnormal; Notable for the following:    pH, Arterial 7.464 (*)    pCO2 arterial 47.2 (*)    pO2, Arterial 217.0 (*)    Bicarbonate 33.9 (*)    Acid-Base Excess 9.0 (*)    All other components within normal limits  CULTURE, BLOOD (ROUTINE X 2)  CULTURE, BLOOD (ROUTINE X 2)  MRSA PCR SCREENING  LIPASE, BLOOD  CBC WITH DIFFERENTIAL/PLATELET  PROTIME-INR  TROPONIN I  TROPONIN I  PROCALCITONIN  CBC  MAGNESIUM  PHOSPHORUS  I-STAT CG4 LACTIC ACID, ED    Imaging Review Ct Angio Chest Pe W/cm &/or Wo Cm  09/08/2015  CLINICAL DATA:  Shortness of breath, cough, extremity edema, hypoxia and chest pain. EXAM: CT ANGIOGRAPHY CHEST WITH CONTRAST TECHNIQUE: Multidetector CT imaging of the chest was performed using the standard protocol during bolus administration of intravenous contrast. Multiplanar CT image  reconstructions and MIPs were obtained to evaluate the vascular anatomy. CONTRAST:  OMNIPAQUE IOHEXOL 350 MG/ML SOLN COMPARISON:  Chest x-ray earlier today. FINDINGS: The pulmonary arteries are adequately opacified. There is no evidence of pulmonary embolism. The thoracic aorta is normal in caliber and shows no evidence of aneurysmal disease or dissection. The heart size is at the upper limits of normal. There is suggestion of potential subtle calcified plaque in the distribution of the LAD. No pleural or pericardial fluid identified. Both posterior lower lobes demonstrates streaky opacities likely consistent with atelectasis. Component of pneumonia would be difficult to exclude. No evidence of pulmonary edema, nodule, airway obstruction or pneumothorax. No enlarged lymph nodes are seen. Visualized upper abdominal structures are unremarkable. Bony structures show a severe leftward convex scoliosis of the upper thoracic spine and compensatory rightward curvature in the lower thoracic and upper lumbar spine. Review of the MIP images confirms the above findings. IMPRESSION: 1. No evidence of pulmonary embolism. 2. Prominent atelectasis in both lower lobes. Component of pneumonia would be difficult to exclude. 3. Suggestion of subtle calcified plaque in the distribution of the left anterior descending coronary artery. 4. Severe scoliosis. Electronically Signed   By: Irish Lack M.D.   On: 09/08/2015 12:03   Dg Chest Port 1 View  09/09/2015  CLINICAL DATA:  Respiratory failure. EXAM: PORTABLE CHEST 1 VIEW COMPARISON:  09/08/2015. FINDINGS: Endotracheal tube in stable position. Interval placement of NG tube, its tip appears to be below the left hemidiaphragm. The lower left hemithorax is not imaged. Cardiomegaly bilateral pulmonary infiltrates suggesting congestive heart failure. Bilateral pneumonia cannot be excluded. No prominent pleural effusion. No pneumothorax. IMPRESSION: 1. Placement NG tube, its tip  appears to be below the left hemidiaphragm. However the lower left hemithorax is not imaged. Endotracheal tube in stable position. 2. Cardiomegaly with bilateral pulmonary infiltrates suggest congestive heart failure. Bilateral pneumonia cannot be excluded. Electronically Signed   By: Maisie Fus  Register   On: 09/09/2015 07:09   Dg Chest Port 1 View  09/08/2015  CLINICAL DATA:  Post intubation EXAM: PORTABLE CHEST 1 VIEW COMPARISON:  CT of the chest same day FINDINGS: Cardiomegaly. Dextroscoliosis lower thoracic and upper lumbar spine. Central vascular congestion without pulmonary edema. Bilateral basilar atelectasis or infiltrate. Endotracheal tube in place with tip 3. 5 cm above the carina. No pneumothorax. IMPRESSION: Endotracheal tube in place. No pneumothorax. Bilateral basilar atelectasis or infiltrate. Electronically Signed   By: Natasha Mead M.D.   On: 09/08/2015 13:27   Dg Chest  Port 1 View  09/08/2015  CLINICAL DATA:  Cough and shortness of breath, 2-3 weeks duration. EXAM: PORTABLE CHEST 1 VIEW COMPARISON:  None. FINDINGS: There is thoracic deformity related to spinal curvature. Heart size is normal. Mediastinal shadows are normal allowing for the spinal curvature. There is patchy infiltrate in both lung bases, left worse than right, consistent with basilar pneumonia. No dense consolidation or lobar collapse. No effusion. IMPRESSION: Thoracic deformity related to spinal curvature. Basilar pneumonia left more than right. Electronically Signed   By: Paulina Fusi M.D.   On: 09/08/2015 10:15   I have personally reviewed and evaluated these images and lab results as part of my medical decision-making.   EKG Interpretation   Date/Time:  Monday September 08 2015 09:28:53 EDT Ventricular Rate:  107 PR Interval:  144 QRS Duration: 88 QT Interval:  366 QTC Calculation: 488 R Axis:   73 Text Interpretation:  Sinus tachycardia Biatrial enlargement Abnormal ECG  agree. no STEMI Confirmed by Donnald Garre,  MD, Lebron Conners [phone]) on 09/08/2015  9:38:20 AM     Recheck 12:13 patient continues to respond to verbal interaction. He awakens to light touch if sleeping. Repeat ABG however shows decline in patient's respiratory status. CT does not show pulmonary embolus. MDM   Final diagnoses:  Acute respiratory failure with hypoxia and hypercapnia (HCC)   Patient presents describing incremental shortness of breath and lower extremity swelling. He noticed significant worsening over the past 3 days. Patient's ABG indicate significant hypercapnic respiratory failure. The patient was placed on BiPAP. Repeat ABG did not show improvement. The patient did remain verbally interactive and was advised of necessity for intubation. He and his family member were agreeable understanding the nature of his decompensating respiratory failure. He appears to have a combination of hypoventilation syndrome with obesity and hypercapnic\hypoxic respiratory failure. Intubation was uncomplicated and patient was admitted to ICU for further at an upright intensivist.    Arby Barrette, MD 09/09/15 [phone]

## 2015-09-08 NOTE — Plan of Care (Signed)
Problem: ICU Phase Progression Outcomes Goal: Voiding-avoid urinary catheter unless indicated Outcome: Not Met (add Reason) Foley Catheter in place; Aggressive IV diuresis; pt on ventilator     

## 2015-09-08 NOTE — Progress Notes (Signed)
New admit from [facility], placed pt on current vent settings, pt tolerating well, RT will monitor

## 2015-09-08 NOTE — ED Notes (Signed)
Patient transported to CT with RN and Respiratory at bedside.  Pt on cardiac monitor and bipap during transfer.  Pt assisted with transfer to CT scanner and back to stretcher once completed.  Pt then transported back to room after completion.  Pt tolerated well.

## 2015-09-08 NOTE — ED Notes (Addendum)
MD at bedside preparing to entubate pt. Crystal Mabe, RT and Cleatrice BurkeMarva Simms, RN, and Mauricio Poavid Cole, EMT assisting.

## 2015-09-08 NOTE — ED Notes (Signed)
Left Radial Artery x1 attempt for ABG. Pt on 3lpm at this time for approx. 10 minutes prior to sample. Bleeding stopped. No complications noted.

## 2015-09-08 NOTE — ED Notes (Signed)
Pt placed on Bipap at this time per MD order and ABG results. Pt alert however falls asleep.

## 2015-09-09 ENCOUNTER — Inpatient Hospital Stay (HOSPITAL_COMMUNITY): Payer: Medicaid Other

## 2015-09-09 DIAGNOSIS — R0600 Dyspnea, unspecified: Secondary | ICD-10-CM

## 2015-09-09 DIAGNOSIS — J9601 Acute respiratory failure with hypoxia: Secondary | ICD-10-CM | POA: Diagnosis present

## 2015-09-09 DIAGNOSIS — J9602 Acute respiratory failure with hypercapnia: Secondary | ICD-10-CM

## 2015-09-09 LAB — BLOOD GAS, ARTERIAL
ACID-BASE EXCESS: 9.1 mmol/L — AB (ref 0.0–2.0)
ACID-BASE EXCESS: 10.1 mmol/L — AB (ref 0.0–2.0)
Bicarbonate: 32.4 mEq/L — ABNORMAL HIGH (ref 20.0–24.0)
Bicarbonate: 34.7 mEq/L — ABNORMAL HIGH (ref 20.0–24.0)
DRAWN BY: 44589
Drawn by: 362771
FIO2: 0.4
FIO2: 0.4
LHR: 18 {breaths}/min
MECHVT: 600 mL
MECHVT: 600 mL
O2 SAT: 92.1 %
O2 Saturation: 92.7 %
PATIENT TEMPERATURE: 98.6
PCO2 ART: 52.9 mmHg — AB (ref 35.0–45.0)
PEEP/CPAP: 5 cmH2O
PEEP/CPAP: 5 cmH2O
PH ART: 7.433 (ref 7.350–7.450)
PO2 ART: 61.7 mmHg — AB (ref 80.0–100.0)
PO2 ART: 71 mmHg — AB (ref 80.0–100.0)
Patient temperature: 97.6
RATE: 10 resp/min
TCO2: 33.5 mmol/L (ref 0–100)
TCO2: 36.4 mmol/L (ref 0–100)
pCO2 arterial: 37.1 mmHg (ref 35.0–45.0)
pH, Arterial: 7.547 — ABNORMAL HIGH (ref 7.350–7.450)

## 2015-09-09 LAB — BASIC METABOLIC PANEL
Anion gap: 9 (ref 5–15)
BUN: 16 mg/dL (ref 6–20)
CHLORIDE: 103 mmol/L (ref 101–111)
CO2: 31 mmol/L (ref 22–32)
CREATININE: 1.18 mg/dL (ref 0.61–1.24)
Calcium: 8.5 mg/dL — ABNORMAL LOW (ref 8.9–10.3)
GFR calc non Af Amer: >60 mL/min (ref >60)
Glucose, Bld: 158 mg/dL — ABNORMAL HIGH (ref 65–99)
Potassium: 3.3 mmol/L — ABNORMAL LOW (ref 3.5–5.1)
Sodium: 143 mmol/L (ref 135–145)

## 2015-09-09 LAB — CBC
HEMATOCRIT: 46.5 % (ref 39.0–52.0)
HEMOGLOBIN: 15 g/dL (ref 13.0–17.0)
MCH: 30.9 pg (ref 26.0–34.0)
MCHC: 32.3 g/dL (ref 30.0–36.0)
MCV: 95.7 fL (ref 78.0–100.0)
Platelets: 271 10*3/uL (ref 150–400)
RBC: 4.86 MIL/uL (ref 4.22–5.81)
RDW: 15 % (ref 11.5–15.5)
WBC: 8.2 10*3/uL (ref 4.0–10.5)

## 2015-09-09 LAB — PHOSPHORUS
PHOSPHORUS: 2.5 mg/dL (ref 2.5–4.6)
PHOSPHORUS: 3.4 mg/dL (ref 2.5–4.6)
Phosphorus: 5.2 mg/dL — ABNORMAL HIGH (ref 2.5–4.6)

## 2015-09-09 LAB — GLUCOSE, CAPILLARY
GLUCOSE-CAPILLARY: 146 mg/dL — AB (ref 65–99)
GLUCOSE-CAPILLARY: 133 mg/dL — AB (ref 65–99)
Glucose-Capillary: 140 mg/dL — ABNORMAL HIGH (ref 65–99)

## 2015-09-09 LAB — MAGNESIUM
MAGNESIUM: 2.2 mg/dL (ref 1.7–2.4)
Magnesium: 2.2 mg/dL (ref 1.7–2.4)
Magnesium: 2.3 mg/dL (ref 1.7–2.4)

## 2015-09-09 LAB — TROPONIN I: TROPONIN I: 0.03 ng/mL (ref <0.031)

## 2015-09-09 MED ORDER — VITAL HIGH PROTEIN PO LIQD
1000.0000 mL | ORAL | Status: DC
  Filled 2015-09-09 (×2): qty 1000

## 2015-09-09 MED ORDER — FENTANYL CITRATE (PF) 100 MCG/2ML IJ SOLN
50.0000 ug | INTRAMUSCULAR | Status: DC | PRN
  Administered 2015-09-09 – 2015-09-11 (×8): 50 ug via INTRAVENOUS
  Filled 2015-09-09 (×8): qty 2

## 2015-09-09 MED ORDER — PRO-STAT SUGAR FREE PO LIQD
30.0000 mL | Freq: Two times a day (BID) | ORAL | Status: DC
  Filled 2015-09-09 (×2): qty 30

## 2015-09-09 MED ORDER — POTASSIUM CHLORIDE 20 MEQ/15ML (10%) PO SOLN
20.0000 meq | ORAL | Status: AC
  Administered 2015-09-09 (×2): 20 meq
  Filled 2015-09-09 (×2): qty 15

## 2015-09-09 MED ORDER — VITAL HIGH PROTEIN PO LIQD
1000.0000 mL | ORAL | Status: DC
  Administered 2015-09-09: 1000 mL
  Filled 2015-09-09 (×2): qty 1000

## 2015-09-09 MED ORDER — PRO-STAT SUGAR FREE PO LIQD
60.0000 mL | Freq: Every day | ORAL | Status: DC
  Administered 2015-09-09 – 2015-09-10 (×4): 60 mL
  Filled 2015-09-09 (×8): qty 60

## 2015-09-09 MED ORDER — ENOXAPARIN SODIUM 60 MG/0.6ML ~~LOC~~ SOLN
60.0000 mg | SUBCUTANEOUS | Status: DC
  Administered 2015-09-09 – 2015-09-13 (×5): 60 mg via SUBCUTANEOUS
  Filled 2015-09-09 (×6): qty 0.6

## 2015-09-09 NOTE — Progress Notes (Signed)
[facility] FOR AM LAB REPLACEMENT ONLY  The patient does apply for the [facility] Adult [facility] Electrolyte Replacment Protocol based on the criteria listed below:   1. Is GFR >/= 40 ml/min? Yes.    Patient's GFR today is >60 2. Is urine output >/= 0.5 ml/kg/hr for the last 6 hours? Yes.   Patient's UOP is 0.5 ml/kg/hr 3. Is BUN < 60 mg/dL? Yes.    Patient's BUN today is 16 4. Abnormal electrolyte  K 3.3 5. Ordered repletion with: per protocol 6. If a panic level lab has been reported, has the CCM MD in charge been notified? Yes.  .   Physician:  Tonny BranchSommer  Gearldene Fiorenza, Lang Snowlizabeth McEachran 09/09/2015 5:16 AM

## 2015-09-09 NOTE — Progress Notes (Signed)
PULMONARY / CRITICAL CARE MEDICINE   Name: Shane FallenKenneth Hinton MRN: [id number] DOB: 08/02/1961    ADMISSION DATE:  09/08/2015 CONSULTATION DATE:  09/08/2015  REFERRING MD :  [facility] [place] [facility]  CHIEF COMPLAINT:  Respiratory failure  INITIAL PRESENTATION: 54 year old male who presented to [facility] [place] complaining of lower extremity edema and shortness of breath for several weeks. Chest x-ray showed bibasilar opacification. His respiratory status declined and he required intubation. [facility] was [facility].  STUDIES:  CTA chest 10/31> no evidence of PE. Atelectasis versus pneumonia bibasilar.  CXR 11/1>> Cardiomegaly with bilateral pulmonary infiltrates suggest congestive heart failure. Bilateral pneumonia cannot be excluded  SIGNIFICANT EVENTS: 10/31 admit   SUBJECTIVE:  Intubated, afebrile.   VITAL SIGNS: Temp:  [95.4 F (35.2 C)-98.9 F (37.2 C)] 97.7 F (36.5 C) (11/01 0900) Pulse Rate:  [60-136] 66 (11/01 0900) Resp:  [12-18] 18 (11/01 0900) BP: (122-180)/(72-114) 137/78 mmHg (11/01 0900) SpO2:  [93 %-100 %] 95 % (11/01 0900) FiO2 (%):  [30 %-100 %] 40 % (11/01 0723) Weight:  [265 lb (120.203 kg)-290 lb 2 oz (131.6 kg)] 282 lb 13.6 oz (128.3 kg) (11/01 0500) HEMODYNAMICS:   VENTILATOR SETTINGS: Vent Mode:  [-] PRVC FiO2 (%):  [30 %-100 %] 40 % Set Rate:  [18 bmp] 18 bmp Vt Set:  [600 mL] 600 mL PEEP:  [5 cmH20] 5 cmH20 Plateau Pressure:  [24 cmH20-30 cmH20] 30 cmH20 INTAKE / OUTPUT:  Intake/Output Summary (Last 24 hours) at 09/09/15 0941 Last data filed at 09/09/15 0800  Gross per 24 hour  Intake 2127.6 ml  Output   4405 ml  Net -2277.4 ml    PHYSICAL EXAMINATION: General:  Morbidly obese male on ventilator Neuro:  Mildly sedated on vent, responds to sternal rub, will open eyes  HEENT:  [place]/AT, no appreciable JVD due to neck girth, PERRL, conjunctival injection appreciated Cardiovascular:  RRR, no MRG Lungs:  Coarse  bilateral breath sounds Abdomen:  Obese, soft, non-distended, +BS GU: foley in place with yellow urine Musculoskeletal:  No acute deformity, LE with +1 pedal edema b/l Skin:  Grossly intact  I/O last 3 completed shifts: In: 2084.3 [I.V.:1914.3; NG/GT:120; IV Piggyback:50] Out: 4080 [Urine:4080] Total I/O In: 43.3 [I.V.:43.3] Out: 325 [Urine:325]  LABS:  CBC  Recent Labs Lab 09/08/15 0945 09/09/15 0347  WBC 7.9 8.2  HGB 15.6 15.0  HCT 50.3 46.5  PLT 278 271   Coag's  Recent Labs Lab 09/08/15 0945  INR 0.97   BMET  Recent Labs Lab 09/08/15 0945 09/09/15 0347  NA 142 143  K 3.8 3.3*  CL 101 103  CO2 36* 31  BUN 21* 16  CREATININE 1.08 1.18  GLUCOSE 185* 158*   Electrolytes  Recent Labs Lab 09/08/15 0945 09/09/15 0347  CALCIUM 8.6* 8.5*  MG  --  2.2  PHOS  --  2.5   Sepsis Markers  Recent Labs Lab 09/08/15 0944 09/08/15 1021 09/08/15 1628  LATICACIDVEN <0.30* 1.46  --   PROCALCITON  --   --  0.10   ABG  Recent Labs Lab 09/08/15 1206 09/08/15 1409 09/09/15 0450  PHART 7.197* 7.464* 7.547*  PCO2ART 97.8* 47.2* 37.1  PO2ART 79.0* 217.0* 61.7*   Liver Enzymes  Recent Labs Lab 09/08/15 0945  AST 73*  ALT 153*  ALKPHOS 65  BILITOT 0.6  ALBUMIN 3.5   Cardiac Enzymes  Recent Labs Lab 09/08/15 1628 09/08/15 2225 09/09/15 0347  TROPONINI 0.06* <0.03 0.03  Glucose  Recent Labs Lab 09/08/15 1614  GLUCAP 164*    Imaging Ct Angio Chest Pe W/cm &/or Wo Cm  09/08/2015  CLINICAL DATA:  Shortness of breath, cough, extremity edema, hypoxia and chest pain. EXAM: CT ANGIOGRAPHY CHEST WITH CONTRAST TECHNIQUE: Multidetector CT imaging of the chest was performed using the standard protocol during bolus administration of intravenous contrast. Multiplanar CT image reconstructions and MIPs were obtained to evaluate the vascular anatomy. CONTRAST:  OMNIPAQUE IOHEXOL 350 MG/ML SOLN COMPARISON:  Chest x-ray earlier today. FINDINGS: The  pulmonary arteries are adequately opacified. There is no evidence of pulmonary embolism. The thoracic aorta is normal in caliber and shows no evidence of aneurysmal disease or dissection. The heart size is at the upper limits of normal. There is suggestion of potential subtle calcified plaque in the distribution of the LAD. No pleural or pericardial fluid identified. Both posterior lower lobes demonstrates streaky opacities likely consistent with atelectasis. Component of pneumonia would be difficult to exclude. No evidence of pulmonary edema, nodule, airway obstruction or pneumothorax. No enlarged lymph nodes are seen. Visualized upper abdominal structures are unremarkable. Bony structures show a severe leftward convex scoliosis of the upper thoracic spine and compensatory rightward curvature in the lower thoracic and upper lumbar spine. Review of the MIP images confirms the above findings. IMPRESSION: 1. No evidence of pulmonary embolism. 2. Prominent atelectasis in both lower lobes. Component of pneumonia would be difficult to exclude. 3. Suggestion of subtle calcified plaque in the distribution of the left anterior descending coronary artery. 4. Severe scoliosis. Electronically Signed   By: Irish Lack M.D.   On: 09/08/2015 12:03   Dg Chest Port 1 View  09/09/2015  CLINICAL DATA:  Respiratory failure. EXAM: PORTABLE CHEST 1 VIEW COMPARISON:  09/08/2015. FINDINGS: Endotracheal tube in stable position. Interval placement of NG tube, its tip appears to be below the left hemidiaphragm. The lower left hemithorax is not imaged. Cardiomegaly bilateral pulmonary infiltrates suggesting congestive heart failure. Bilateral pneumonia cannot be excluded. No prominent pleural effusion. No pneumothorax. IMPRESSION: 1. Placement NG tube, its tip appears to be below the left hemidiaphragm. However the lower left hemithorax is not imaged. Endotracheal tube in stable position. 2. Cardiomegaly with bilateral pulmonary  infiltrates suggest congestive heart failure. Bilateral pneumonia cannot be excluded. Electronically Signed   By: Maisie Fus  Register   On: 09/09/2015 07:09   Dg Chest Port 1 View  09/08/2015  CLINICAL DATA:  Post intubation EXAM: PORTABLE CHEST 1 VIEW COMPARISON:  CT of the chest same day FINDINGS: Cardiomegaly. Dextroscoliosis lower thoracic and upper lumbar spine. Central vascular congestion without pulmonary edema. Bilateral basilar atelectasis or infiltrate. Endotracheal tube in place with tip 3. 5 cm above the carina. No pneumothorax. IMPRESSION: Endotracheal tube in place. No pneumothorax. Bilateral basilar atelectasis or infiltrate. Electronically Signed   By: Natasha Mead M.D.   On: 09/08/2015 13:27   Dg Chest Port 1 View  09/08/2015  CLINICAL DATA:  Cough and shortness of breath, 2-3 weeks duration. EXAM: PORTABLE CHEST 1 VIEW COMPARISON:  None. FINDINGS: There is thoracic deformity related to spinal curvature. Heart size is normal. Mediastinal shadows are normal allowing for the spinal curvature. There is patchy infiltrate in both lung bases, left worse than right, consistent with basilar pneumonia. No dense consolidation or lobar collapse. No effusion. IMPRESSION: Thoracic deformity related to spinal curvature. Basilar pneumonia left more than right. Electronically Signed   By: Paulina Fusi M.D.   On: 09/08/2015 10:15  ASSESSMENT / PLAN:  PULMONARY OETT 10/31 >>> A: Acute on chronic hypercarbic/hypoxemic respiratory failure Suspect decompensated OHS ?Pulmonary edema  P:   Full vent support Diurese as tolerated  For neg balance Control afterload VAP bundle Repeat ABG 7.54, reduce MV- repeat abg after SBT likely today PRN nebs  CARDIOVASCULAR A:  HTN R/o LVH P:  Telemetry monitoring PRN hydralazine Echo ordered Lasix  BID, see renal Strict I/O  RENAL A:   Hypervolemia, hypokalemia  pulm edema P:   KVO IVF Diurese as above Follow Bmet Correct electrolytes  as indicated k supp  GASTROINTESTINAL A:   No acute issues  P:   NPO start TF Pepcid for SUP  HEMATOLOGIC A:   VTE ppx  P:  Enoxaparin for VTE ppx- dose adjusted per thuy pharmacy Follow CBC  INFECTIOUS A:   No evidence pNA  P:   BCx2 10/31 >>> Monitor off ABX Follow WBC and fever curve CT reviewed  ENDOCRINE A:   Hyperglycemia without history of DM  P:   CBG monitoring and SSI  NEUROLOGIC A:   Acute metabolic encephalopathy in setting hypercarbia/hypoxemia.   P:   RASS goal: -1 Propofol infusion titrated to RASS goal WUA in AM  FAMILY  - Updates: Spoke with sister Will on 10/31 PM  - Inter-disciplinary family meet or Palliative Care meeting due by:  11/6  Ashly M. Adam Phenix PGY-2, [facility] Pager [phone]  09/09/2015 9:41 AM   STAFF NOTE: Cindi Carbon, MD FACP have personally reviewed patient's available data, including medical history, events of note, physical examination and test results as part of my evaluation. I have discussed with resident/NP and other care providers such as pharmacist, RN and RRT. In addition, I personally evaluated patient and elicited key findings of: rass -3, no rhonchi, needs WUA, i updated sisters at bedside, pcxr and ct c/e edema, get echo, neg balance goals remain, ABg reviewed, reduce MV, repeat abg, upright, WUA, after above vent changes, then sbt, start TF, add int fentanyl, control afterload The patient is critically ill with multiple organ systems failure and requires high complexity decision making for assessment and support, frequent evaluation and titration of therapies, application of advanced monitoring technologies and extensive interpretation of multiple databases.   Critical Care Time devoted to patient care services described in this note is 35 Minutes. This time reflects time of care of this signee: Rory Percy, MD FACP. This critical care time does not reflect procedure time, or  teaching time or supervisory time of PA/NP/Med student/Med Resident etc but could involve care discussion time. Rest per NP/medical resident whose note is outlined above and that I agree with   Mcarthur Rossetti. Tyson Alias, MD, FACP Pgr: [phone] [facility] 09/09/2015 11:04 AM

## 2015-09-09 NOTE — Progress Notes (Signed)
Initial Nutrition Assessment  DOCUMENTATION CODES:   Morbid obesity  INTERVENTION:    Initiate TF via OGT with Vital High Protein at goal rate of 10 ml/h (240 ml per day) and Prostat 60 ml 5 times daily to provide 1240 kcals, 171 gm protein (95% of estimated needs), 201 ml free water daily.  Total calorie intake with current calories from propofol + TF will be 2190 kcals per day.  NUTRITION DIAGNOSIS:   Inadequate oral intake related to inability to eat as evidenced by NPO status.  GOAL:   Provide needs based on ASPEN/SCCM guidelines  MONITOR:   TF tolerance, Vent status, Weight trends, Labs, I & O's  REASON FOR ASSESSMENT:   Consult Enteral/tube feeding initiation and management  ASSESSMENT:   54 year old male who [facility] [place] complaining of lower extremity edema and shortness of breath for several weeks. Chest x-ray showed bibasilar opacification. His respiratory status declined and he required intubation. He was [facility].  Nutrition focused physical exam completed.  No muscle or subcutaneous fat depletion noticed. Received MD Consult for TF initiation and management. Labs reviewed: potassium low, magnesium and phosphorus WNL.  Patient is currently intubated on ventilator support Temp (24hrs), Avg:98.1 F (36.7 C), Min:95.4 F (35.2 C), Max:98.9 F (37.2 C)  Propofol: 36 ml/hr providing 950 kcals/day. RN hopeful to decrease rate soon.   Diet Order:  Diet NPO time specified  Skin:  Reviewed, no issues  Last BM:  unknown  Height:   Ht Readings from Last 1 Encounters:  09/08/15 5\' 9"  (1.753 m)    Weight:   Wt Readings from Last 1 Encounters:  09/09/15 282 lb 13.6 oz (128.3 kg)    Ideal Body Weight:  72.7 kg  BMI:  Body mass index is 41.75 kg/(m^2).  Estimated Nutritional Needs:   Kcal:  1410-1795  Protein:  180 gm  Fluid:  >/= 2 L  EDUCATION NEEDS:   No education needs identified at  this time  Joaquin CourtsKimberly Harris, RD, LDN, CNSC Pager [phone] After Hours Pager [phone]

## 2015-09-09 NOTE — Progress Notes (Signed)
  Echocardiogram 2D Echocardiogram has been performed.  Tye SavoyCasey N Jeffrey Graefe 09/09/2015, 2:09 PM

## 2015-09-10 ENCOUNTER — Inpatient Hospital Stay (HOSPITAL_COMMUNITY): Payer: Medicaid Other

## 2015-09-10 LAB — PHOSPHORUS
PHOSPHORUS: 5.8 mg/dL — AB (ref 2.5–4.6)
Phosphorus: 5.9 mg/dL — ABNORMAL HIGH (ref 2.5–4.6)

## 2015-09-10 LAB — MAGNESIUM
Magnesium: 2.5 mg/dL — ABNORMAL HIGH (ref 1.7–2.4)
Magnesium: 2.2 mg/dL (ref 1.7–2.4)

## 2015-09-10 LAB — BASIC METABOLIC PANEL
Anion gap: 10 (ref 5–15)
BUN: 22 mg/dL — ABNORMAL HIGH (ref 6–20)
CALCIUM: 8.7 mg/dL — AB (ref 8.9–10.3)
CO2: 35 mmol/L — AB (ref 22–32)
CREATININE: 1.38 mg/dL — AB (ref 0.61–1.24)
Chloride: 99 mmol/L — ABNORMAL LOW (ref 101–111)
GFR calc Af Amer: >60 mL/min (ref >60)
GFR calc non Af Amer: 57 mL/min — ABNORMAL LOW (ref >60)
GLUCOSE: 121 mg/dL — AB (ref 65–99)
Potassium: 3.9 mmol/L (ref 3.5–5.1)
Sodium: 144 mmol/L (ref 135–145)

## 2015-09-10 LAB — CBC
HEMATOCRIT: 45.9 % (ref 39.0–52.0)
HEMOGLOBIN: 14.6 g/dL (ref 13.0–17.0)
MCH: 30.9 pg (ref 26.0–34.0)
MCHC: 31.8 g/dL (ref 30.0–36.0)
MCV: 97.2 fL (ref 78.0–100.0)
Platelets: 269 10*3/uL (ref 150–400)
RBC: 4.72 MIL/uL (ref 4.22–5.81)
RDW: 16 % — AB (ref 11.5–15.5)
WBC: 6.5 10*3/uL (ref 4.0–10.5)

## 2015-09-10 LAB — GLUCOSE, CAPILLARY
GLUCOSE-CAPILLARY: 120 mg/dL — AB (ref 65–99)
GLUCOSE-CAPILLARY: 129 mg/dL — AB (ref 65–99)
Glucose-Capillary: 116 mg/dL — ABNORMAL HIGH (ref 65–99)
Glucose-Capillary: 123 mg/dL — ABNORMAL HIGH (ref 65–99)
Glucose-Capillary: 125 mg/dL — ABNORMAL HIGH (ref 65–99)

## 2015-09-10 LAB — TRIGLYCERIDES: TRIGLYCERIDES: 240 mg/dL — AB (ref <150)

## 2015-09-10 MED ORDER — VITAL HIGH PROTEIN PO LIQD
1000.0000 mL | ORAL | Status: DC
  Administered 2015-09-10 – 2015-09-11 (×2): 1000 mL
  Filled 2015-09-10 (×3): qty 1000

## 2015-09-10 MED ORDER — VITAL HIGH PROTEIN PO LIQD: 1000.0000 mL | ORAL | Status: DC

## 2015-09-10 MED ORDER — FAMOTIDINE 40 MG/5ML PO SUSR
20.0000 mg | Freq: Two times a day (BID) | ORAL | Status: DC
  Administered 2015-09-10 – 2015-09-11 (×3): 20 mg via ORAL
  Filled 2015-09-10 (×6): qty 2.5

## 2015-09-10 MED ORDER — DEXMEDETOMIDINE HCL IN NACL 400 MCG/100ML IV SOLN
0.0000 ug/kg/h | INTRAVENOUS | Status: DC
  Administered 2015-09-10 (×2): 1 ug/kg/h via INTRAVENOUS
  Administered 2015-09-10: 0.8 ug/kg/h via INTRAVENOUS
  Administered 2015-09-11: 1.1 ug/kg/h via INTRAVENOUS
  Administered 2015-09-11: 1 ug/kg/h via INTRAVENOUS
  Filled 2015-09-10 (×5): qty 100

## 2015-09-10 MED ORDER — INSULIN ASPART 100 UNIT/ML ~~LOC~~ SOLN
0.0000 [IU] | SUBCUTANEOUS | Status: DC
  Administered 2015-09-10 – 2015-09-11 (×3): 2 [IU] via SUBCUTANEOUS
  Administered 2015-09-11: 1 [IU] via SUBCUTANEOUS
  Administered 2015-09-11: 2 [IU] via SUBCUTANEOUS
  Administered 2015-09-12: 5 [IU] via SUBCUTANEOUS
  Administered 2015-09-12: 1 [IU] via SUBCUTANEOUS
  Administered 2015-09-12: 5 [IU] via SUBCUTANEOUS
  Administered 2015-09-13 (×2): 2 [IU] via SUBCUTANEOUS
  Administered 2015-09-13 (×2): 3 [IU] via SUBCUTANEOUS
  Administered 2015-09-13: 2 [IU] via SUBCUTANEOUS

## 2015-09-10 MED ORDER — FUROSEMIDE 10 MG/ML IJ SOLN
40.0000 mg | Freq: Every day | INTRAMUSCULAR | Status: DC
  Administered 2015-09-11: 40 mg via INTRAVENOUS
  Filled 2015-09-10: qty 4

## 2015-09-10 MED ORDER — DEXMEDETOMIDINE HCL IN NACL 200 MCG/50ML IV SOLN
0.0000 ug/kg/h | INTRAVENOUS | Status: DC
  Administered 2015-09-10: 0.8 ug/kg/h via INTRAVENOUS
  Administered 2015-09-10: 0.6 ug/kg/h via INTRAVENOUS
  Administered 2015-09-10: 1 ug/kg/h via INTRAVENOUS
  Filled 2015-09-10 (×3): qty 50

## 2015-09-10 MED ORDER — PRO-STAT SUGAR FREE PO LIQD
30.0000 mL | Freq: Every day | ORAL | Status: DC
  Administered 2015-09-10 – 2015-09-11 (×5): 30 mL
  Filled 2015-09-10 (×9): qty 30

## 2015-09-10 NOTE — Progress Notes (Signed)
Pt given flu and pneumonia vaccines per Tyson AliasFeinstein. Family expressed that the patient did not have a PCP and that they were unsure of his last pneumonia vaccination. Dr Tyson AliasFeinstein made aware. Instructions to give both flu and pneumonia vaccines to this pt as the benefit outweighed any risk. Will continue to monitor.

## 2015-09-10 NOTE — Progress Notes (Signed)
Nutrition Follow-up  DOCUMENTATION CODES:   Morbid obesity  INTERVENTION:    Continue Vital High Protein via OGT, increase to goal rate of 50 ml/h (1200 ml per day) with Prostat 30 ml 5 times per day to provide 1700 kcals, 180 gm protein, 1003 ml free water daily.  NUTRITION DIAGNOSIS:   Inadequate oral intake related to inability to eat as evidenced by NPO status.  Ongoing  GOAL:   Provide needs based on ASPEN/SCCM guidelines  Progressing  MONITOR:   TF tolerance, Vent status, Weight trends, Labs, I & O's   ASSESSMENT:   54 year old male who [facility] [place] complaining of lower extremity edema and shortness of breath for several weeks. Chest x-ray showed bibasilar opacification. His respiratory status declined and he required intubation. [facility].  Discussed patient in ICU rounds and with RN today. Propofol has been discontinued, RD to adjust TF to better meet nutrition needs. Currently receiving Vital High Protein at 10 ml/h (240 ml per day) with Prostat 60 ml 5 times per day to provide 1240 kcals, 171 gm protein, 201 ml free water daily.  Patient is currently intubated on ventilator support Temp (24hrs), Avg:98.7 F (37.1 C), Min:97.9 F (36.6 C), Max:99.3 F (37.4 C)  Propofol: off   Diet Order:  Diet NPO time specified  Skin:  Reviewed, no issues  Last BM:  unknown  Height:   Ht Readings from Last 1 Encounters:  09/08/15 5\' 9"  (1.753 m)    Weight:   Wt Readings from Last 1 Encounters:  09/10/15 278 lb 14.1 oz (126.5 kg)    Ideal Body Weight:  72.7 kg  BMI:  Body mass index is 41.16 kg/(m^2).  Estimated Nutritional Needs:   Kcal:  1410-1795  Protein:  180 gm  Fluid:  >/= 2 L  EDUCATION NEEDS:   No education needs identified at this time  Joaquin CourtsKimberly Harris, RD, LDN, CNSC Pager [phone] After Hours Pager [phone]

## 2015-09-10 NOTE — Progress Notes (Signed)
eLink Physician-Brief Progress Note Patient Name: Shane Hinton DOB: 01/19/1961 MRN: [id number]   Date of Service  09/10/2015  HPI/Events of Note  Blood glucose = 167.  eICU Interventions  Will add Q 4 hour sensitive Novolog SSI.     Intervention Category Intermediate Interventions: Hyperglycemia - evaluation and treatment  Tmya Wigington Dennard Nipugene 09/10/2015, 8:27 PM

## 2015-09-10 NOTE — Progress Notes (Signed)
PULMONARY / CRITICAL CARE MEDICINE   Name: Shane Hinton MRN: [id number] DOB: 1961/10/09    ADMISSION DATE:  09/08/2015 CONSULTATION DATE:  09/08/2015  REFERRING MD :  [facility] [place] [facility]  CHIEF COMPLAINT:  Respiratory failure  INITIAL PRESENTATION: 54 year old male who presented to [facility] [place] complaining of lower extremity edema and shortness of breath for several weeks. Chest x-ray showed bibasilar opacification. His respiratory status declined and he required intubation. He was transferred to [facility] for ICU admission.  STUDIES:  CTA chest 10/31> no evidence of PE. Atelectasis versus pneumonia bibasilar.  CXR 11/1>> Cardiomegaly with bilateral pulmonary infiltrates suggest congestive heart failure. Bilateral pneumonia cannot be excluded  Echo 11/1>> Grade 1 diastolic dysfunction EF 65-70%, LVH  SIGNIFICANT EVENTS: 10/31 admit   SUBJECTIVE:  Intubated, NGT feeds initiated yesterday.  Yesterday, RN reports that patient was briefly disconnected from the vent.  He desaturated to the 50's.  Immediately came back up to 90's after vent reconnected.  VITAL SIGNS: Temp:  [97.5 F (36.4 C)-99.3 F (37.4 C)] 98.3 F (36.8 C) (11/02 0500) Pulse Rate:  [63-87] 74 (11/02 0500) Resp:  [11-18] 11 (11/02 0500) BP: (109-154)/(65-93) 139/87 mmHg (11/02 0500) SpO2:  [92 %-100 %] 100 % (11/02 0500) FiO2 (%):  [40 %] 40 % (11/02 0400) Weight:  [278 lb 14.1 oz (126.5 kg)] 278 lb 14.1 oz (126.5 kg) (11/02 0500) HEMODYNAMICS:   VENTILATOR SETTINGS: Vent Mode:  [-] PRVC FiO2 (%):  [40 %] 40 % Set Rate:  [10 bmp-18 bmp] 10 bmp Vt Set:  [530 mL-600 mL] 530 mL PEEP:  [5 cmH20] 5 cmH20 Plateau Pressure:  [22 cmH20-30 cmH20] 22 cmH20 INTAKE / OUTPUT:  Intake/Output Summary (Last 24 hours) at 09/10/15 0649 Last data filed at 09/10/15 0600  Gross per 24 hour  Intake 1848.46 ml  Output   4340 ml  Net -2491.54 ml    PHYSICAL EXAMINATION: General:  Morbidly obese male  on ventilator Neuro:  Mildly sedated on vent, responds to sternal rub, will open eyes, RASS -2  HEENT:  [place]/AT, no appreciable JVD due to neck girth, PERRL, conjunctival injection appreciated Cardiovascular:  RRR, no MRG Lungs:  Good air movement, breath sounds much improved from yesterday, seemingly CTAB Abdomen:  Obese, soft, non-distended, +BS GU: foley in place with yellow urine Musculoskeletal:  No acute deformity, LE without edema Skin:  Grossly intact  I/O last 3 completed shifts: In: 2966.5 [I.V.:2532.6; NG/GT:333.8; IV Piggyback:100] Out: 5920 [Urine:5660; Emesis/NG output:260] Total I/O In: 923 [I.V.:533; NG/GT:340; IV Piggyback:50] Out: 2150 [Urine:2150]  LABS:  CBC  Recent Labs Lab 09/08/15 0945 09/09/15 0347  WBC 7.9 8.2  HGB 15.6 15.0  HCT 50.3 46.5  PLT 278 271   Coag's  Recent Labs Lab 09/08/15 0945  INR 0.97   BMET  Recent Labs Lab 09/08/15 0945 09/09/15 0347 09/10/15 0544  NA 142 143 144  K 3.8 3.3* 3.9  CL 101 103 99*  CO2 36* 31 35*  BUN 21* 16 22*  CREATININE 1.08 1.18 1.38*  GLUCOSE 185* 158* 121*   Electrolytes  Recent Labs Lab 09/08/15 0945  09/09/15 0347 09/09/15 1235 09/09/15 2319 09/10/15 0544  CALCIUM 8.6*  --  8.5*  --   --  8.7*  MG  --   < > 2.2 2.3 2.2 2.5*  PHOS  --   < > 2.5 3.4 5.2* 5.9*  < > = values in this interval not displayed. Sepsis Markers  Recent Labs Lab 09/08/15  [id number] 09/08/15 1021 09/08/15 1628  LATICACIDVEN <0.30* 1.46  --   PROCALCITON  --   --  0.10   ABG  Recent Labs Lab 09/08/15 1409 09/09/15 0450 09/09/15 1216  PHART 7.464* 7.547* 7.433  PCO2ART 47.2* 37.1 52.9*  PO2ART 217.0* 61.7* 71.0*   Liver Enzymes  Recent Labs Lab 09/08/15 0945  AST 73*  ALT 153*  ALKPHOS 65  BILITOT 0.6  ALBUMIN 3.5   Cardiac Enzymes  Recent Labs Lab 09/08/15 1628 09/08/15 2225 09/09/15 0347  TROPONINI 0.06* <0.03 0.03   Glucose  Recent Labs Lab 09/08/15 1614 09/09/15 1155  09/09/15 1550 09/09/15 1945 09/10/15 0014 09/10/15 0411  GLUCAP 164* 146* 133* 140* 120* 116*    Imaging No results found.   ASSESSMENT / PLAN:  PULMONARY OETT 10/31 >>> A: Acute on chronic hypercarbic/hypoxemic respiratory failure Suspect decompensated OHS ?Pulmonary edema  P:   Diurese completed Control afterload VAP bundle Repeat ABG pH 7.43 after reduced MV SBT likely today, goal cpap 5 ps 5 PRN nebs  CARDIOVASCULAR A:  HTN Grade 1 diastolic dysfunction and pattern of LVH on echo P:  Telemetry monitoring PRN hydralazine Lasix IV 40mg  BID, likely can decrease to qd  Strict I/O  RENAL A:   Hypervolemia improved Hypokalemia, resolved Pulm edema improved AKI P:   KVO IVF Diurese as above Follow Bmet, Cr 1.38 Correct electrolytes as indicated Lasix drop  GASTROINTESTINAL A:   No acute issues  P:   Start TF Pepcid for SUP  HEMATOLOGIC A:   VTE ppx  P:  Enoxaparin for VTE ppx- dose adjusted per [facility] pharmacy Follow CBC  INFECTIOUS A:   No evidence pNA  P:   BCx2 10/31 >>> NG x1 day Monitor off ABX Follow WBC and fever curve  ENDOCRINE A:   Hyperglycemia without history of DM  P:   CBG monitoring ensure tsh done  NEUROLOGIC A:   Acute metabolic encephalopathy in setting hypercarbia/hypoxemia.   P:   RASS goal: -1 Propofol infusion titrated to RASS goal WUA this am, amndatory  FAMILY  - Updates: Spoke with sister Will on 10/31 PM  - Inter-disciplinary family meet or Palliative Care meeting due by:  11/6  Ashly M. Adam PhenixGottschalk, DO PGY-2, [facility] Pager [phone]  09/10/2015 6:49 AM   STAFF NOTE: Cindi CarbonI, Danine Hor, MD FACP have personally reviewed patient's available data, including medical history, events of note, physical examination and test results as part of my evaluation. I have discussed with resident/NP and other care providers such as pharmacist, RN and RRT. In addition, I personally evaluated  patient and elicited key findings of:  More awake, pcxr improved and less coarse overall on lung exam, does follow command, echo reviewed, diast  Noted, HTN played a role, LVH noted, weaning failed, PS increase attempt then, lasix reduction, NO ABX needed, continued to control BP, upright position, if continues to have persistent pcxr infiltrates, will send work up int lung dz, neg balance goals remain, may need cvp assessment The patient is critically ill with multiple organ systems failure and requires high complexity decision making for assessment and support, frequent evaluation and titration of therapies, application of advanced monitoring technologies and extensive interpretation of multiple databases.   Critical Care Time devoted to patient care services described in this note is 35 Minutes. This time reflects time of care of this signee: Rory Percyaniel Jaelon Gatley, MD FACP. This critical care time does not reflect procedure time, or teaching time or supervisory time of PA/NP/Med  student/Med Resident etc but could involve care discussion time. Rest per NP/medical resident whose note is outlined above and that I agree with   Mcarthur Rossetti. Tyson Alias, MD, FACP Pgr: [phone] [place] [facility] 09/10/2015 11:06 AM

## 2015-09-11 LAB — GLUCOSE, CAPILLARY
GLUCOSE-CAPILLARY: 160 mg/dL — AB (ref 65–99)
GLUCOSE-CAPILLARY: 81 mg/dL (ref 65–99)
GLUCOSE-CAPILLARY: 106 mg/dL — AB (ref 65–99)
Glucose-Capillary: 144 mg/dL — ABNORMAL HIGH (ref 65–99)
Glucose-Capillary: 167 mg/dL — ABNORMAL HIGH (ref 65–99)
Glucose-Capillary: 180 mg/dL — ABNORMAL HIGH (ref 65–99)
Glucose-Capillary: 86 mg/dL (ref 65–99)

## 2015-09-11 LAB — BASIC METABOLIC PANEL
ANION GAP: 5 (ref 5–15)
BUN: 21 mg/dL — ABNORMAL HIGH (ref 6–20)
CALCIUM: 8.5 mg/dL — AB (ref 8.9–10.3)
CO2: 34 mmol/L — ABNORMAL HIGH (ref 22–32)
CREATININE: 1.05 mg/dL (ref 0.61–1.24)
Chloride: 104 mmol/L (ref 101–111)
Glucose, Bld: 160 mg/dL — ABNORMAL HIGH (ref 65–99)
Potassium: 3.2 mmol/L — ABNORMAL LOW (ref 3.5–5.1)
Sodium: 143 mmol/L (ref 135–145)

## 2015-09-11 LAB — CBC
HCT: 48.9 % (ref 39.0–52.0)
HEMOGLOBIN: 15.6 g/dL (ref 13.0–17.0)
MCH: 31 pg (ref 26.0–34.0)
MCHC: 31.9 g/dL (ref 30.0–36.0)
MCV: 97.2 fL (ref 78.0–100.0)
PLATELETS: 295 10*3/uL (ref 150–400)
RBC: 5.03 MIL/uL (ref 4.22–5.81)
RDW: 15.4 % (ref 11.5–15.5)
WBC: 8.9 10*3/uL (ref 4.0–10.5)

## 2015-09-11 LAB — TSH: TSH: 2.728 u[IU]/mL (ref 0.350–4.500)

## 2015-09-11 LAB — PHOSPHORUS
PHOSPHORUS: 4.6 mg/dL (ref 2.5–4.6)
Phosphorus: 4.7 mg/dL — ABNORMAL HIGH (ref 2.5–4.6)

## 2015-09-11 LAB — MAGNESIUM
MAGNESIUM: 2.4 mg/dL (ref 1.7–2.4)
MAGNESIUM: 2.4 mg/dL (ref 1.7–2.4)

## 2015-09-11 MED ORDER — DEXMEDETOMIDINE HCL IN NACL 400 MCG/100ML IV SOLN: 0.0000 ug/kg/h | INTRAVENOUS | Status: DC

## 2015-09-11 MED ORDER — POLYETHYLENE GLYCOL 3350 17 G PO PACK
17.0000 g | PACK | Freq: Every day | ORAL | Status: DC
  Administered 2015-09-13 – 2015-09-14 (×2): 17 g via ORAL
  Filled 2015-09-11 (×4): qty 1

## 2015-09-11 MED ORDER — FUROSEMIDE 10 MG/ML IJ SOLN
40.0000 mg | Freq: Two times a day (BID) | INTRAMUSCULAR | Status: DC
  Administered 2015-09-11 – 2015-09-13 (×4): 40 mg via INTRAVENOUS
  Filled 2015-09-11 (×4): qty 4

## 2015-09-11 MED ORDER — FAMOTIDINE 20 MG PO TABS
20.0000 mg | ORAL_TABLET | Freq: Two times a day (BID) | ORAL | Status: DC
  Administered 2015-09-11: 20 mg via ORAL
  Filled 2015-09-11 (×3): qty 1

## 2015-09-11 MED ORDER — POTASSIUM CHLORIDE 20 MEQ/15ML (10%) PO SOLN
30.0000 meq | ORAL | Status: AC
  Administered 2015-09-11 (×2): 30 meq
  Filled 2015-09-11 (×2): qty 30

## 2015-09-11 NOTE — Plan of Care (Signed)
Problem: Phase I Progression Outcomes Goal: Voiding-avoid urinary catheter unless indicated Outcome: Not Met (add Reason) Foley required for pt safety

## 2015-09-11 NOTE — Procedures (Signed)
Extubation Procedure Note  Patient Details:   Name: Shane Hinton DOB: 09/25/1961 MRN: [id number]   Airway Documentation:  Airway 7.5 mm (Active)  Secured at (cm) 24 cm 09/11/2015  7:35 AM  Measured From Lips 09/11/2015  7:35 AM  Secured Location Center 09/11/2015  7:35 AM  Secured By [employer] Tube Holder 09/11/2015  7:35 AM  Tube Holder Repositioned Yes 09/11/2015  7:35 AM  Cuff Pressure (cm H2O) 26 cm H2O 09/11/2015  3:32 AM  Site Condition Dry 09/11/2015  7:35 AM    Evaluation  O2 sats: stable throughout and currently acceptable Complications: No apparent complications Patient did tolerate procedure well. Bilateral Breath Sounds: Clear, Diminished Suctioning: Airway Yes   Prior to extubation: pt suctioned orally, positive cuff leak noted.  Post-extubation: Pt able to cough to produce sputum, state his name, no stridor noted.  Antoine Pocherogdon, Gabbriella Presswood Caroline 09/11/2015, 10:13 AM

## 2015-09-11 NOTE — Plan of Care (Signed)
Problem: Phase I Progression Outcomes Goal: Other Phase I Outcomes/Goals Outcome: Progressing Remove foley

## 2015-09-11 NOTE — Progress Notes (Signed)
Placed patient on CPAP via auto-mode for the night with minimum pressure set at 6cm and maximum pressure set at 20cm. Oxygen set at 3lpm with Sp02=93%.

## 2015-09-11 NOTE — Clinical Documentation Improvement (Signed)
Critical Care Pulmonology  Based on the clinical findings below, please document any associated diagnoses/conditions the patient has or may have.   Acute diastolic CHF  Acute on chronic diastolic CHF  Other  Clinically Undetermined  Supporting Information: --  H&P gives "? Pulmonary edema" and "Volume overloaded on exam", lower extremity edema with SOB, "decompensated cor pulmonale"  -- CXR 11/1 "bilateral pulmonary infiltrates suggest congestive heart failure" -- ECHO 11/1 "grade 1 diastolic dysfunction -- Lasix 40mg  IV bid  Please exercise your independent, professional judgment when responding. A specific answer is not anticipated or expected.   Thank You, Beverley FiedlerLaurie E Shavonne Ambroise RN [facility] [place] [phone]

## 2015-09-11 NOTE — Progress Notes (Signed)
PULMONARY / CRITICAL CARE MEDICINE   Name: Shane FallenKenneth Vanhise MRN: [id number] DOB: 03/07/1961    ADMISSION DATE:  09/08/2015 CONSULTATION DATE:  09/08/2015  REFERRING MD :  [facility] [place] [facility]  CHIEF COMPLAINT:  Respiratory failure  INITIAL PRESENTATION: 54 year old male who presented to [facility] [place] complaining of lower extremity edema and shortness of breath for several weeks. Chest x-ray showed bibasilar opacification. His respiratory status declined and he required intubation. [facility].  STUDIES:  CTA chest 10/31> no evidence of PE. Atelectasis versus pneumonia bibasilar.  CXR 11/1>> Cardiomegaly with bilateral pulmonary infiltrates suggest congestive heart failure. Bilateral pneumonia cannot be excluded  Echo 11/1>> Grade 1 diastolic dysfunction EF 65-70%, LVH  SIGNIFICANT EVENTS: 10/31 admit   SUBJECTIVE:  Intubated, awake, trying to grab at ETT  VITAL SIGNS: Temp:  [97.9 F (36.6 C)-100.2 F (37.9 C)] 100.2 F (37.9 C) (11/03 0700) Pulse Rate:  [58-85] 65 (11/03 0700) Resp:  [12-22] 22 (11/03 0700) BP: (103-166)/(62-95) 134/77 mmHg (11/03 0700) SpO2:  [93 %-100 %] 97 % (11/03 0700) FiO2 (%):  [40 %] 40 % (11/03 0735) Weight:  [277 lb 1.9 oz (125.7 kg)] 277 lb 1.9 oz (125.7 kg) (11/03 0500) HEMODYNAMICS:   VENTILATOR SETTINGS: Vent Mode:  [-] PSV;CPAP FiO2 (%):  [40 %] 40 % Set Rate:  [10 bmp] 10 bmp Vt Set:  [530 mL] 530 mL PEEP:  [5 cmH20] 5 cmH20 Pressure Support:  [5 cmH20] 5 cmH20 Plateau Pressure:  [22 cmH20-27 cmH20] 27 cmH20 INTAKE / OUTPUT:  Intake/Output Summary (Last 24 hours) at 09/11/15 0749 Last data filed at 09/11/15 0700  Gross per 24 hour  Intake 2329.2 ml  Output   1445 ml  Net  884.2 ml    PHYSICAL EXAMINATION: General:  Morbidly obese male on ventilator Neuro:  Awake on vent, nods yes/ no to questioning HEENT:  [place]/AT, no appreciable JVD due to neck girth, PERRL, conjunctival  injection appreciated Cardiovascular:  RRR, no MRG Lungs:  Good air movement, breath sounds CTAB Abdomen:  Obese, soft, non-distended, +BS GU: foley in place with yellow urine Musculoskeletal:  No acute deformity, LE with trace edema, hand mittens in place Skin:  Grossly intact  I/O last 3 completed shifts: In: 3553.8 [I.V.:1648.8; NG/GT:1855; IV Piggyback:50] Out: 3745 [Urine:3745]    LABS:  CBC  Recent Labs Lab 09/08/15 0945 09/09/15 0347 09/10/15 1020  WBC 7.9 8.2 6.5  HGB 15.6 15.0 14.6  HCT 50.3 46.5 45.9  PLT 278 271 269   Coag's  Recent Labs Lab 09/08/15 0945  INR 0.97   BMET  Recent Labs Lab 09/09/15 0347 09/10/15 0544 09/11/15 0239  NA 143 144 143  K 3.3* 3.9 3.2*  CL 103 99* 104  CO2 31 35* 34*  BUN 16 22* 21*  CREATININE 1.18 1.38* 1.05  GLUCOSE 158* 121* 160*   Electrolytes  Recent Labs Lab 09/09/15 0347  09/10/15 0544 09/10/15 1020 09/10/15 2323 09/11/15 0239  CALCIUM 8.5*  --  8.7*  --   --  8.5*  MG 2.2  < > 2.5* 2.2 2.4  --   PHOS 2.5  < > 5.9* 5.8* 4.6  --   < > = values in this interval not displayed. Sepsis Markers  Recent Labs Lab 09/08/15 0944 09/08/15 1021 09/08/15 1628  LATICACIDVEN <0.30* 1.46  --   PROCALCITON  --   --  0.10   ABG  Recent Labs Lab 09/08/15 1409 09/09/15  0450 09/09/15 1216  PHART 7.464* 7.547* 7.433  PCO2ART 47.2* 37.1 52.9*  PO2ART 217.0* 61.7* 71.0*   Liver Enzymes  Recent Labs Lab 09/08/15 0945  AST 73*  ALT 153*  ALKPHOS 65  BILITOT 0.6  ALBUMIN 3.5   Cardiac Enzymes  Recent Labs Lab 09/08/15 1628 09/08/15 2225 09/09/15 0347  TROPONINI 0.06* <0.03 0.03   Glucose  Recent Labs Lab 09/10/15 0732 09/10/15 1122 09/10/15 1535 09/10/15 1939 09/10/15 2333 09/11/15 0351  GLUCAP 123* 125* 129* 167* 144* 180*    Imaging No results found.   ASSESSMENT / PLAN:  PULMONARY OETT 10/31 >>> A: Acute on chronic hypercarbic/hypoxemic respiratory failure Suspect  decompensated OHS ?Pulmonary edema  P:   Diurese completed Control afterload VAP bundle SBT today, goal cpap 5 ps 5 PRN nebs Will consider nocturnal cpap  empiric  CARDIOVASCULAR A:  HTN Grade 1 diastolic dysfunction and pattern of LVH on echo P:  Telemetry monitoring PRN hydralazine Lasix IV 40 mg, increase Strict I/O  RENAL A:   Hypervolemia improved Hypokalemia Pulm edema improved AKI improved P:   KVO IVF Diurese as above Follow Bmet, improved Cr 1.05 Correct electrolytes as indicated Lasix increase k supp  GASTROINTESTINAL A:   No acute issues  P:   Start TF Pepcid for SUP Ad mir, no BM noted  HEMATOLOGIC A:   VTE ppx  P:  Enoxaparin  Follow CBC  INFECTIOUS A:   No evidence PNA  P:   BCx2 10/31 >>> NG x2 days Monitor off ABX Follow WBC and fever curve  ENDOCRINE A:   Hyperglycemia without history of DM  P:   CBG monitoring, SSI TSH 2.728  NEUROLOGIC A:   Acute metabolic encephalopathy in setting hypercarbia/hypoxemia.   P:   RASS goal: -1 Precedex infusion titrated to RASS goal WUA this am  FAMILY  - Updates: Spoke with sister Will on 10/31 PM  - Inter-disciplinary family meet or Palliative Care meeting due by:  11/6  Ashly M. Adam Phenix PGY-2, [facility] Pager [phone]  09/11/2015 7:49 AM   STAFF NOTE: Cindi Carbon, MD FACP have personally reviewed patient's available data, including medical history, events of note, physical examination and test results as part of my evaluation. I have discussed with resident/NP and other care providers such as pharmacist, RN and RRT. In addition, I personally evaluated patient and elicited key findings of:  MAjor improved neuro with precedex, awake follows commands, renal fxn better, improved ronchi, weaning ps 5/5, goal 30 min, re increase lasix, continued to control BP, if extubated, add cpap empiric noctunral, no infection noted, k supp  The patient is  critically ill with multiple organ systems failure and requires high complexity decision making for assessment and support, frequent evaluation and titration of therapies, application of advanced monitoring technologies and extensive interpretation of multiple databases.   Critical Care Time devoted to patient care services described in this note is 30 Minutes. This time reflects time of care of this signee: Rory Percy, MD FACP. This critical care time does not reflect procedure time, or teaching time or supervisory time of PA/NP/Med student/Med Resident etc but could involve care discussion time. Rest per NP/medical resident whose note is outlined above and that I agree with   Mcarthur Rossetti. Tyson Alias, MD, FACP Pgr: [phone] [facility] 09/11/2015 7:49 AM

## 2015-09-11 NOTE — Progress Notes (Signed)
[facility] AD[facility] REPL[facility] PROTOCOL FOR AM LAB REPLACEMENT ONLY  The patient does apply for the [facility] Adult [facility] Electrolyte Replacment Protocol based on the criteria listed below:   1. Is GFR >/= 40 ml/min? Yes.    Patient's GFR today is >60 2. Is urine output >/= 0.5 ml/kg/hr for the last 6 hours? Yes.   Patient's UOP is 0.635ml/kg/hr 3. Is BUN < 60 mg/dL? Yes.    Patient's BUN today is 21 4. Abnormal electrolyte  K 3.2 5. Ordered repletion with: per protocol 6. If a panic level lab has been reported, has the CCM MD in charge been notified? Yes.  .   Physician:  Vincenza Hewsasa  Anvith Mauriello, Lanora Manislizabeth McEachran 09/11/2015 5:10 AM

## 2015-09-12 LAB — PHOSPHORUS: PHOSPHORUS: 4.4 mg/dL (ref 2.5–4.6)

## 2015-09-12 LAB — CBC
HEMATOCRIT: 47.5 % (ref 39.0–52.0)
Hemoglobin: 14.7 g/dL (ref 13.0–17.0)
MCH: 30.7 pg (ref 26.0–34.0)
MCHC: 30.9 g/dL (ref 30.0–36.0)
MCV: 99.2 fL (ref 78.0–100.0)
PLATELETS: 264 10*3/uL (ref 150–400)
RBC: 4.79 MIL/uL (ref 4.22–5.81)
RDW: 15.5 % (ref 11.5–15.5)
WBC: 7.3 10*3/uL (ref 4.0–10.5)

## 2015-09-12 LAB — BASIC METABOLIC PANEL
Anion gap: 9 (ref 5–15)
BUN: 20 mg/dL (ref 6–20)
CO2: 35 mmol/L — ABNORMAL HIGH (ref 22–32)
CREATININE: 1.17 mg/dL (ref 0.61–1.24)
Calcium: 8.7 mg/dL — ABNORMAL LOW (ref 8.9–10.3)
Chloride: 99 mmol/L — ABNORMAL LOW (ref 101–111)
Glucose, Bld: 105 mg/dL — ABNORMAL HIGH (ref 65–99)
POTASSIUM: 3.5 mmol/L (ref 3.5–5.1)
SODIUM: 143 mmol/L (ref 135–145)

## 2015-09-12 LAB — GLUCOSE, CAPILLARY
GLUCOSE-CAPILLARY: 168 mg/dL — AB (ref 65–99)
GLUCOSE-CAPILLARY: 87 mg/dL (ref 65–99)
GLUCOSE-CAPILLARY: 144 mg/dL — AB (ref 65–99)
Glucose-Capillary: 90 mg/dL (ref 65–99)
Glucose-Capillary: 263 mg/dL — ABNORMAL HIGH (ref 65–99)
Glucose-Capillary: 267 mg/dL — ABNORMAL HIGH (ref 65–99)

## 2015-09-12 LAB — MAGNESIUM: Magnesium: 2.4 mg/dL (ref 1.7–2.4)

## 2015-09-12 NOTE — Progress Notes (Signed)
PULMONARY / CRITICAL CARE MEDICINE   Name: Shane Hinton MRN: [id number] DOB: 15-Feb-1961    ADMISSION DATE:  09/08/2015 CONSULTATION DATE:  09/08/2015  REFERRING MD :  [facility] [place] [facility]  CHIEF COMPLAINT:  Respiratory failure  INITIAL PRESENTATION: 54 year old male who presented to [facility] [place] complaining of lower extremity edema and shortness of breath for several weeks. Chest x-ray showed bibasilar opacification. His respiratory status declined and he required intubation. He was transferred to [facility] for ICU admission.  STUDIES:  CTA chest 10/31> no evidence of PE. Atelectasis versus pneumonia bibasilar.  CXR 11/1>> Cardiomegaly with bilateral pulmonary infiltrates suggest congestive heart failure. Bilateral pneumonia cannot be excluded  Echo 11/1>> Grade 1 diastolic dysfunction EF 65-70%, LVH  SIGNIFICANT EVENTS: 10/31 admit  11/3: extubated  SUBJECTIVE:  Patient reports that he is doing well.  Tolerated diet.  Foley out and is voiding and having BMs.    VITAL SIGNS: Temp:  [98.4 F (36.9 C)-100.2 F (37.9 C)] 98.4 F (36.9 C) (11/04 0351) Pulse Rate:  [65-109] 84 (11/04 0600) Resp:  [16-29] 25 (11/04 0600) BP: (126-157)/(70-112) 138/85 mmHg (11/04 0600) SpO2:  [86 %-100 %] 94 % (11/04 0600) FiO2 (%):  [40 %] 40 % (11/03 0800) Weight:  [269 lb 2.9 oz (122.1 kg)] 269 lb 2.9 oz (122.1 kg) (11/04 0428) HEMODYNAMICS:   VENTILATOR SETTINGS: Vent Mode:  [-] PSV;CPAP FiO2 (%):  [40 %] 40 % PEEP:  [5 cmH20] 5 cmH20 Pressure Support:  [5 cmH20] 5 cmH20 INTAKE / OUTPUT:  Intake/Output Summary (Last 24 hours) at 09/12/15 0649 Last data filed at 09/12/15 0400  Gross per 24 hour  Intake 973.38 ml  Output   3650 ml  Net -2676.62 ml    PHYSICAL EXAMINATION: General:  Morbidly obese male on 4L O2 [place] Neuro: Responds appropriately to questions, moves all extremities independently  HEENT:  [place]/AT, no appreciable JVD due to neck girth,  PERRL Cardiovascular:  RRR, no MRG Lungs:  Good air movement, breath sounds CTAB, [place] in place saturating 96% Abdomen:  Obese, soft, non-distended, +BS GU: foley out, dark urine in bedside urinal Musculoskeletal:  No acute deformity, LE without edema Skin:  Grossly intact  I/O last 3 completed shifts: In: 3207 [P.O.:600; I.V.:1137; NG/GT:1470] Out: 4570 [Urine:4570] Total I/O In: -  Out: 675 [Urine:675]  LABS:  CBC  Recent Labs Lab 09/10/15 1020 09/11/15 1203 09/12/15 0247  WBC 6.5 8.9 7.3  HGB 14.6 15.6 14.7  HCT 45.9 48.9 47.5  PLT 269 295 264   Coag's  Recent Labs Lab 09/08/15 0945  INR 0.97   BMET  Recent Labs Lab 09/10/15 0544 09/11/15 0239 09/12/15 0247  NA 144 143 143  K 3.9 3.2* 3.5  CL 99* 104 99*  CO2 35* 34* 35*  BUN 22* 21* 20  CREATININE 1.38* 1.05 1.17  GLUCOSE 121* 160* 105*   Electrolytes  Recent Labs Lab 09/10/15 0544  09/10/15 2323 09/11/15 0239 09/11/15 1203 09/11/15 2335 09/12/15 0247  CALCIUM 8.7*  --   --  8.5*  --   --  8.7*  MG 2.5*  < > 2.4  --  2.4 2.4  --   PHOS 5.9*  < > 4.6  --  4.7* 4.4  --   < > = values in this interval not displayed. Sepsis Markers  Recent Labs Lab 09/08/15 0944 09/08/15 1021 09/08/15 1628  LATICACIDVEN <0.30* 1.46  --   PROCALCITON  --   --  0.10  ABG  Recent Labs Lab 09/08/15 1409 09/09/15 0450 09/09/15 1216  PHART 7.464* 7.547* 7.433  PCO2ART 47.2* 37.1 52.9*  PO2ART 217.0* 61.7* 71.0*   Liver Enzymes  Recent Labs Lab 09/08/15 0945  AST 73*  ALT 153*  ALKPHOS 65  BILITOT 0.6  ALBUMIN 3.5   Cardiac Enzymes  Recent Labs Lab 09/08/15 1628 09/08/15 2225 09/09/15 0347  TROPONINI 0.06* <0.03 0.03   Glucose  Recent Labs Lab 09/11/15 0351 09/11/15 0751 09/11/15 1143 09/11/15 1531 09/11/15 2334 09/12/15 0346  GLUCAP 180* 160* 81 106* 86 87    Imaging No results found.   ASSESSMENT / PLAN:  PULMONARY OETT 10/31 >>> A: Acute on chronic  hypercarbic/hypoxemic respiratory failure Suspect decompensated OHS ?Pulmonary edema  P:   Diurese completed Control afterload Breathing on 4L O2, wean as tolerated PRN nebs Empiric CPAP at night Will need appt with pulm for sleep study at discharge.  CARDIOVASCULAR A:  HTN Grade 1 diastolic dysfunction and pattern of LVH on echo P:  Telemetry monitoring PRN hydralazine Lasix IV 40 mg BID Strict I/O  RENAL A:   Hypervolemia improved Hypokalemia improved Pulm edema improved AKI improved P:   KVO IVF Diurese as above, -3.65 L/24 hours Follow Bmet Correct electrolytes as indicated Lasix  Replete electrolytes as needed   GASTROINTESTINAL A:   No acute issues  P:   Start TF Pepcid for SUP  HEMATOLOGIC A:   VTE ppx  P:  Enoxaparin  Follow CBC  INFECTIOUS A:   No evidence PNA  P:   BCx2 10/31 >>> NG x3 days Monitor off ABX Follow WBC and fever curve  ENDOCRINE A:   Hyperglycemia without history of DM  P:   CBG monitoring, SSI  NEUROLOGIC A:   Acute metabolic encephalopathy in setting hypercarbia/hypoxemia, resolved  P:   Resolved, to Med-Surg floor  FAMILY  - Updates: Spoke with sister Will on 10/31 PM  - Inter-disciplinary family meet or Palliative Care meeting due by:  11/6  Jonah Gingras M. Adam PhenixGottschalk, DO PGY-2, [facility] Pager [phone]  09/12/2015 6:49 AM

## 2015-09-12 NOTE — Progress Notes (Signed)
Nutrition Follow-up  DOCUMENTATION CODES:   Morbid obesity  INTERVENTION:   -Continue with Heart Healthy/ Carb Modified diet  NUTRITION DIAGNOSIS:   Inadequate oral intake related to inability to eat as evidenced by NPO status.  Resolved  GOAL:   Patient will meet greater than or equal to 90% of their needs  Met  MONITOR:   PO intake, Labs, Weight trends, Skin, I & O's  REASON FOR ASSESSMENT:   Consult Enteral/tube feeding initiation and management  ASSESSMENT:   54 year old male who presented to [facility] complaining of lower extremity edema and shortness of breath for several weeks. Chest x-ray showed bibasilar opacification. His respiratory status declined and he required intubation. He was transferred to [facility] for ICU admission.  Pt was extubated on 09/11/15. Nutrition needs re-calculated to refect change in status.   Pt transferred out of ICU to medical floor on 09/12/15. He has been advanced to a Heart Healthy/ Carb Modified diet; good appetite (meal completion 60-100%).   Labs reviewed.  Diet Order:  Diet heart healthy/carb modified Room service appropriate?: Yes; Fluid consistency:: Thin  Skin:  Reviewed, no issues  Last BM:  09/11/15  Height:   Ht Readings from Last 1 Encounters:  09/08/15 5' 9"  (1.753 m)    Weight:   Wt Readings from Last 1 Encounters:  09/12/15 269 lb 2.9 oz (122.1 kg)    Ideal Body Weight:  72.7 kg  BMI:  Body mass index is 39.73 kg/(m^2).  Estimated Nutritional Needs:   Kcal:  1800-2000  Protein:  85-100 grams  Fluid:  1.8-2.0 L  EDUCATION NEEDS:   No education needs identified at this time  Syndey Jaskolski A. Jimmye Norman, RD, LDN, CDE Pager: [phone] After hours Pager: [phone]

## 2015-09-12 NOTE — Progress Notes (Signed)
Pt transferred to unit via wheelchair. Alert and oriented. Able to walk from wheelchair to bed. IV in RH clean, dry and intact. Saline locked and flushed. Up to bathroom. SCD's ordered and placed on legs. Pt oriented to room. Call light and telephone within pt reach.  Midge AverVicki Lynnet Hefley, RN

## 2015-09-12 NOTE — Care Management Note (Addendum)
Case Management Note  Patient Details  Name: Shane Hinton MRN: [id number] Date of Birth: 02/07/1961  Subjective/Objective:                  Date- 09-12-15 Initial Assessment Patient transferred from another unit to 5W on 09-12-15. Spoke with patient at the bedside.  Introduced self as Sports coachcase manager and explained role in discharge planning and how to be reached.  [place]  Verified patient anticipates to go home with family at time of discharge and will have part-time supervision by family  at this time to best of their knowledge.  Patient has no DME. Expressed potential need for no other DME.  Patient confirmed  needing help with their medication. [facility]. CM explained to patient that they may use the on site pharmacy to fill prescriptions given to them at discharge. Patient aware that [facility] will not fill narcotics or pain medications prior to the patient being seen by one of their physicians.  Patient aware that they must be seen as a patient prior to the pharmacy filling the prescriptions a second time.  Patient is driven by sister to MD appointments.  Verified patient has no PCP.   Plan: CM will continue to follow for discharge planning and [facility] resources.   Lawerance SabalDebbie Tiffnay Bossi RN BSN CM [phone]   Action/Plan:  [facility]. Entered into AVS.   Expected Discharge Date:   (unknown)               Expected Discharge Plan:  Home/Self Care  In-House Referral:     Discharge planning Services  CM Consult  Post Acute Care Choice:    Choice offered to:     DME Arranged:    DME Agency:     HH Arranged:    HH Agency:     Status of Service:  In process, will continue to follow  Medicare Important Message Given:    Date Medicare IM Given:    Medicare IM give by:    Date Additional Medicare IM Given:     Additional Medicare Important Message give by:     If discussed at Long Length of Stay Meetings, dates discussed:    Additional Comments:  Lawerance SabalDebbie Trinita Devlin, RN 09/12/2015, 11:10 AM

## 2015-09-12 NOTE — Progress Notes (Signed)
Patient refusing CPAP for tonight. States he has been fine just using a nasal cannula. RT made patient aware that if he changes his mind to call. CPAP in room.

## 2015-09-13 DIAGNOSIS — N179 Acute kidney failure, unspecified: Secondary | ICD-10-CM | POA: Diagnosis present

## 2015-09-13 DIAGNOSIS — I5033 Acute on chronic diastolic (congestive) heart failure: Secondary | ICD-10-CM | POA: Diagnosis present

## 2015-09-13 DIAGNOSIS — G9340 Encephalopathy, unspecified: Secondary | ICD-10-CM | POA: Diagnosis present

## 2015-09-13 LAB — BASIC METABOLIC PANEL WITH GFR
Anion gap: 8 (ref 5–15)
BUN: 18 mg/dL (ref 6–20)
CO2: 32 mmol/L (ref 22–32)
Calcium: 8.7 mg/dL — ABNORMAL LOW (ref 8.9–10.3)
Chloride: 101 mmol/L (ref 101–111)
Creatinine, Ser: 1 mg/dL (ref 0.61–1.24)
GFR calc Af Amer: >60 mL/min
GFR calc non Af Amer: >60 mL/min
Glucose, Bld: 119 mg/dL — ABNORMAL HIGH (ref 65–99)
Potassium: 4 mmol/L (ref 3.5–5.1)
Sodium: 141 mmol/L (ref 135–145)

## 2015-09-13 LAB — TRIGLYCERIDES: TRIGLYCERIDES: 99 mg/dL (ref <150)

## 2015-09-13 LAB — GLUCOSE, CAPILLARY
GLUCOSE-CAPILLARY: 182 mg/dL — AB (ref 65–99)
GLUCOSE-CAPILLARY: 209 mg/dL — AB (ref 65–99)
GLUCOSE-CAPILLARY: 210 mg/dL — AB (ref 65–99)
Glucose-Capillary: 138 mg/dL — ABNORMAL HIGH (ref 65–99)
Glucose-Capillary: 170 mg/dL — ABNORMAL HIGH (ref 65–99)
Glucose-Capillary: 183 mg/dL — ABNORMAL HIGH (ref 65–99)

## 2015-09-13 LAB — CULTURE, BLOOD (ROUTINE X 2)
Culture: NO GROWTH
Culture: NO GROWTH

## 2015-09-13 MED ORDER — FUROSEMIDE 40 MG PO TABS
40.0000 mg | ORAL_TABLET | Freq: Every day | ORAL | Status: DC
  Administered 2015-09-14: 40 mg via ORAL
  Filled 2015-09-13: qty 1

## 2015-09-13 MED ORDER — INSULIN ASPART 100 UNIT/ML ~~LOC~~ SOLN
0.0000 [IU] | Freq: Three times a day (TID) | SUBCUTANEOUS | Status: DC
  Administered 2015-09-14: 1 [IU] via SUBCUTANEOUS

## 2015-09-13 NOTE — Progress Notes (Signed)
PROGRESS NOTE  Shane Hinton AVW:[id number] DOB: [birth year] DOA: 09/08/2015 PCP: No PCP Per Patient   HPI: 54 year old male who presented to [facility] [place] complaining of lower extremity edema and shortness of breath for several weeks. Chest x-ray showed bibasilar opacification. His respiratory status declined and he required intubation. He was transferred to [facility] for ICU admission.  CTA chest 10/31> no evidence of PE. Atelectasis versus pneumonia bibasilar. CXR 11/1>> Cardiomegaly with bilateral pulmonary infiltrates suggest congestive heart failure. Bilateral pneumonia cannot be excluded Echo 11/1>> Grade 1 diastolic dysfunction EF 65-70%, LVH  Subjective / 24 H Interval events - feeling well, breathing improved  Assessment/Plan: Active Problems:   Acute respiratory failure (HCC)   Acute respiratory failure with hypoxia and hypercapnia (HCC)   Acute on chronic diastolic heart failure (HCC)   AKI (acute kidney injury) (HCC)   Morbid obesity (HCC)   Acute encephalopathy  Acute on chronic hypercarbic/hypoxemic respiratory failure - likely due to OSA / Obesity hypoventilation - intubated on admission, extubated 11/3 - wean off oxygen as able - no PE on CT angio, echo with grade 1 diastolic dysfunction - patient with also a component of pulmonary edema per admission CXR  Acute on chronic Grade 1 diastolic dysfunction and pattern of LVH on echo - Lasix IV 40 mg BID, stop 11/5, convert to po Lasix  - net negative 6.5 L, weight down 290 >> 269 - LE edema resolved  Hypokalemia improved - supplement as needed  AKI improved - improved, renal function stable with diuresis  Acute metabolic encephalopathy in setting hypercarbia/hypoxemia - resolved   Diet: Diet heart healthy/carb modified Room service appropriate?: Yes; Fluid consistency:: Thin Fluids: none DVT Prophylaxis: Lovenox  Code Status: Full Code Family Communication: d/w wife bedside  Disposition  Plan: home 1-2 days  Barriers to discharge: diuresis  Consultants:  PCCM  Procedures:  2D echo   Antibiotics  Anti-infectives    Start     Dose/Rate Route Frequency Ordered Stop   09/08/15 1045  cefTRIAXone (ROCEPHIN) 1 G injection    Comments:  Cleatrice Burke   : cabinet override      09/08/15 1045 09/08/15 1050   09/08/15 1044  azithromycin (ZITHROMAX) 500 MG injection    Comments:  Simms, Marva   : cabinet override      09/08/15 1044 09/08/15 1050   09/08/15 1030  cefTRIAXone (ROCEPHIN) 1 g in dextrose 5 % 50 mL IVPB     1 g 100 mL/hr over 30 Minutes Intravenous  Once 09/08/15 1025 09/08/15 1203   09/08/15 1030  azithromycin (ZITHROMAX) 500 mg in dextrose 5 % 250 mL IVPB     500 mg 250 mL/hr over 60 Minutes Intravenous  Once 09/08/15 1025 09/08/15 1411       Studies  No results found.  Objective  Filed Vitals:   09/12/15 1044 09/12/15 1400 09/12/15 2225 09/13/15 0509  BP: 137/78 159/79 131/77 134/65  Pulse: 102 97 100 86  Temp:  98.7 F (37.1 C) 99.2 F (37.3 C) 99.4 F (37.4 C)  TempSrc:  Oral Oral Oral  Resp:  Height:      Weight:    122.018 kg (269 lb)  SpO2: 100% 96% 95% 90%    Intake/Output Summary (Last 24 hours) at 09/13/15 0706 Last data filed at 09/13/15 0600  Gross per 24 hour  Intake    540 ml  Output   1075 ml  Net   -535 ml  Filed Weights   09/11/15 0500 09/12/15 0428 09/13/15 0509  Weight: 125.7 kg (277 lb 1.9 oz) 122.1 kg (269 lb 2.9 oz) 122.018 kg (269 lb)    Exam:  GENERAL: NAD  HEENT: head NCAT, no scleral icterus.   NECK: Supple. No LAD  LUNGS: Clear to auscultation. No wheezing or crackles  HEART: Regular rate and rhythm without murmur. 2+ pulses, no JVD, no peripheral edema  ABDOMEN: Soft, non-distended, non-tender. Positive bowel sounds.  EXTREMITIES: Without any cyanosis or clubbing. Good muscle tone  NEUROLOGIC: Alert and oriented x3. Cranial nerves II through XII are grossly intact. Strength 5/5 in  all 4.   Data Reviewed: Basic Metabolic Panel:  Recent Labs Lab 09/08/15 0945 09/09/15 0347  09/10/15 0544 09/10/15 1020 09/10/15 2323 09/11/15 0239 09/11/15 1203 09/11/15 2335 09/12/15 0247  NA 142 143  --  144  --   --  143  --   --  143  K 3.8 3.3*  --  3.9  --   --  3.2*  --   --  3.5  CL 101 103  --  99*  --   --  104  --   --  99*  CO2 36* 31  --  35*  --   --  34*  --   --  35*  GLUCOSE 185* 158*  --  121*  --   --  160*  --   --  105*  BUN 21* 16  --  22*  --   --  21*  --   --  20  CREATININE 1.08 1.18  --  1.38*  --   --  1.05  --   --  1.17  CALCIUM 8.6* 8.5*  --  8.7*  --   --  8.5*  --   --  8.7*  MG  --  2.2  < > 2.5* 2.2 2.4  --  2.4 2.4  --   PHOS  --  2.5  < > 5.9* 5.8* 4.6  --  4.7* 4.4  --   < > = values in this interval not displayed. Liver Function Tests:  Recent Labs Lab 09/08/15 0945  AST 73*  ALT 153*  ALKPHOS 65  BILITOT 0.6  PROT 6.6  ALBUMIN 3.5    Recent Labs Lab 09/08/15 0945  LIPASE 26   No results for input(s): AMMONIA in the last 168 hours. CBC:  Recent Labs Lab 09/08/15 0945 09/09/15 0347 09/10/15 1020 09/11/15 1203 09/12/15 0247  WBC 7.9 8.2 6.5 8.9 7.3  NEUTROABS 5.9  --   --   --   --   HGB 15.6 15.0 14.6 15.6 14.7  HCT 50.3 46.5 45.9 48.9 47.5  MCV 99.6 95.7 97.2 97.2 99.2  PLT 278 271 269 295 264   Cardiac Enzymes:  Recent Labs Lab 09/08/15 0945 09/08/15 1628 09/08/15 2225 09/09/15 0347  TROPONINI 0.08* 0.06* <0.03 0.03   BNP (last 3 results)  Recent Labs  09/08/15 0945  BNP 130.3*    ProBNP (last 3 results) No results for input(s): PROBNP in the last 8760 hours.  CBG:  Recent Labs Lab 09/12/15 1246 09/12/15 1631 09/12/15 2059 09/13/15 0012 09/13/15 0450  GLUCAP 144* 263* 267* 183* 170*    Recent Results (from the past 240 hour(s))  Culture, blood (routine x 2)     Status: None (Preliminary result)   Collection Time: 09/08/15 10:30 AM  Result Value Ref Range Status   Specimen  Description  BLOOD RIGHT ANTECUBITAL  Final   Special Requests BOTTLES DRAWN AEROBIC AND ANAEROBIC 10CC  Final   Culture   Final    NO GROWTH 4 DAYS Performed at [facility]    Report Status PENDING  Incomplete  Culture, blood (routine x 2)     Status: None (Preliminary result)   Collection Time: 09/08/15 10:40 AM  Result Value Ref Range Status   Specimen Description BLOOD RIGHT HAND  Final   Special Requests BOTTLES DRAWN AEROBIC AND ANAEROBIC  10 CC  Final   Culture   Final    NO GROWTH 4 DAYS Performed at [facility]    Report Status PENDING  Incomplete  MRSA PCR Screening     Status: None   Collection Time: 09/08/15  3:35 PM  Result Value Ref Range Status   MRSA by PCR NEGATIVE NEGATIVE Final    Comment:        The GeneXpert MRSA Assay (FDA approved for NASAL specimens only), is one component of a comprehensive MRSA colonization surveillance program. It is not intended to diagnose MRSA infection nor to guide or monitor treatment for MRSA infections.      Scheduled Meds: . enoxaparin (LOVENOX) injection  60 mg Subcutaneous Q24H  . furosemide  40 mg Intravenous BID  . insulin aspart  0-9 Units Subcutaneous 6 times per day  . polyethylene glycol  17 g Oral Daily   Continuous Infusions: . sodium chloride Stopped (09/11/15 1700)     Pamella Pert, MD [facility] Pager [phone]. If 7 PM - 7 AM, please contact night-coverage at [email or web address], password [facility] 09/13/2015, 7:06 AM  LOS: 5 days

## 2015-09-14 DIAGNOSIS — R7303 Prediabetes: Secondary | ICD-10-CM

## 2015-09-14 LAB — GLUCOSE, CAPILLARY
GLUCOSE-CAPILLARY: 147 mg/dL — AB (ref 65–99)
GLUCOSE-CAPILLARY: 107 mg/dL — AB (ref 65–99)

## 2015-09-14 MED ORDER — FUROSEMIDE 40 MG PO TABS: 40.0000 mg | ORAL_TABLET | Freq: Every day | ORAL | Status: DC | PRN

## 2015-09-14 MED ORDER — AMLODIPINE BESYLATE 5 MG PO TABS
5.0000 mg | ORAL_TABLET | Freq: Every day | ORAL | Status: DC
Start: 2015-09-14 — End: 2015-09-14
  Administered 2015-09-14: 5 mg via ORAL
  Filled 2015-09-14: qty 1

## 2015-09-14 MED ORDER — AMLODIPINE BESYLATE 5 MG PO TABS: 5.0000 mg | ORAL_TABLET | Freq: Every day | ORAL | Status: DC

## 2015-09-14 MED ORDER — POTASSIUM CHLORIDE ER 10 MEQ PO TBCR: 20.0000 meq | EXTENDED_RELEASE_TABLET | Freq: Every day | ORAL | Status: DC | PRN

## 2015-09-14 MED ORDER — METFORMIN HCL 500 MG PO TABS: 500.0000 mg | ORAL_TABLET | Freq: Two times a day (BID) | ORAL | Status: DC

## 2015-09-14 NOTE — Progress Notes (Signed)
02 Saturations re-checked, per NA Jessica results are below.   SATURATION QUALIFICATIONS: (This note is used to comply with regulatory documentation for home oxygen)  Patient Saturations on Room Air at Rest = 92%  Patient Saturations on Room Air while Ambulating = 85%  Patient Saturations on 2 Liters of oxygen while Ambulating = 100%

## 2015-09-14 NOTE — Progress Notes (Signed)
Per Case Manager Sarah patient is to be discharged once oxygen tank is dropped off to room by charity vendor.

## 2015-09-14 NOTE — Progress Notes (Addendum)
SATURATION QUALIFICATIONS: (This note is used to comply with regulatory documentation for home oxygen)  Patient Saturations on Room Air at Rest = 92%  Patient Saturations on Room Air while Ambulating = 79%  Patient Saturations on 2 Liters of oxygen while Ambulating = 86%

## 2015-09-14 NOTE — Progress Notes (Signed)
CM received approval for charity oxygen from [facility], Tiffany and called [facility] DME rep, Trey PaulaJeff to please deliver to pt's room so pt can be discharged.

## 2015-09-14 NOTE — Progress Notes (Signed)
Patient refusing CPAP for tonight. 

## 2015-09-14 NOTE — Discharge Summary (Signed)
Physician Discharge Summary  Shane Hinton ZOX:[id number] DOB: [birth year] DOA: 09/08/2015  PCP: No PCP Per Patient  Admit date: 09/08/2015 Discharge date: 09/14/2015  Time spent: > 30 minutes  Recommendations for Outpatient Follow-up:  1. Follow up with [facility] in 9 days as scheduled 2. Outpatient pulmonary referral and sleep study 3. Home oxygen on discharge  Discharge Diagnoses:  Active Problems:   Acute respiratory failure (HCC)   Acute respiratory failure with hypoxia and hypercapnia (HCC)   Acute on chronic diastolic heart failure (HCC)   AKI (acute kidney injury) (HCC)   Morbid obesity (HCC)   Acute encephalopathy  Discharge Condition: stable  Diet recommendation: heart healthy  Filed Weights   09/11/15 0500 09/12/15 0428 09/13/15 0509  Weight: 125.7 kg (277 lb 1.9 oz) 122.1 kg (269 lb 2.9 oz) 122.018 kg (269 lb)   History of present illness:  See H&P, Labs, Consult and Test reports for all details in brief, patient is a 54 year old male who presented to [facility] [place] complaining of lower extremity edema and shortness of breath for several weeks. Chest x-ray showed bibasilar opacification. His respiratory status declined and he required intubation. He was transferred to [facility] for ICU admission.  Hospital Course:  Acute on chronic hypercarbic/hypoxemic respiratory failure - likely due to OSA / Obesity hypoventilation, patient intubated on admission, extubated 11/3. Wean off oxygen as able, respiratory status at baseline, patient comfortable and asymptomatic on room air however he is desatting requiring oxygen on discharge. No PE on CT angio, echo with grade 1 diastolic dysfunction. He will need a sleep study as an outpatient.  Acute on chronic Grade 1 diastolic dysfunction and pattern of LVH on echo- Lasix IV 40 mg BID, stopped 11/5, convert to po Lasix, weight down 290 >> 269. Advised for daily weights, Lasix as  needed. Hypokalemia improved - supplement as needed AKI improved - improved, renal function stable with diuresis Acute metabolic encephalopathy in setting hypercarbia/hypoxemia - resolved Morbid obesity - extensively counseled for weight loss as will help with his breathing.  Pre-diabetes - last A1C in March 2016 at [facility] was 6.2. Repeat A1C obtained and pending at the time of d/c. Weight loss / diet education provided to the patient. Started on metformin.   Procedures:  2D echo  Study Conclusions - Left ventricle: The cavity size was normal. Wall thickness wasincreased in a pattern of mild LVH. There was mild concentrichypertrophy. Systolic function was vigorous. The estimatedejection fraction was in the range of 65% to 70%. Wall motion wasnormal; there were no regional wall motion abnormalities. Dopplerparameters are consistent with abnormal left ventricularrelaxation (grade 1 diastolic dysfunction). There was no evidenceof elevated ventricular filling pressure by Doppler parameters. - Aortic valve: Trileaflet; normal thickness leaflets. There was noregurgitation. - Aortic root: The aortic root was normal in size. - Mitral valve: Structurally normal valve. - Left atrium: The atrium was normal in size. - Right ventricle: Systolic function was normal. - Right atrium: The atrium was normal in size. - Tricuspid valve: There was trivial regurgitation. - Pulmonic valve: There was no regurgitation. - Pulmonary arteries: Systolic pressure was within the normalrange. - Inferior vena cava: The vessel was normal in size. - Pericardium, extracardiac: There was no pericardial effusion.  Consultations:  PCCM  Discharge Exam: Filed Vitals:   09/14/15 0516 09/14/15 0951 09/14/15 0952 09/14/15 1330  BP: 157/85   152/84  Pulse: 96   95  Temp: 99.1 F (37.3 C)   98.6  F (37 C)  TempSrc: Oral   Oral  Resp: 18   18  Height:      Weight:      SpO2: 92% 79% 92% 92%   General:  NAD Cardiovascular: RRR Respiratory: CTA biL  Discharge Instructions Activity:  As tolerated   Get Medicines reviewed and adjusted: Please take all your medications with you for your next visit with your Primary MD  Please request your Primary MD to go over all hospital tests and procedure/radiological results at the follow up, please ask your Primary MD to get all Hospital records sent to his/her office.  If you experience worsening of your admission symptoms, develop shortness of breath, life threatening emergency, suicidal or homicidal thoughts you must seek medical attention immediately by calling 911 or calling your MD immediately if symptoms less severe.  You must read complete instructions/literature along with all the possible adverse reactions/side effects for all the Medicines you take and that have been prescribed to you. Take any new Medicines after you have completely understood and accpet all the possible adverse reactions/side effects.   Do not drive when taking Pain medications.   Do not take more than prescribed Pain, Sleep and Anxiety Medications  Special Instructions: If you have smoked or chewed Tobacco in the last 2 yrs please stop smoking, stop any regular Alcohol and or any Recreational drug use.  Wear Seat belts while driving.  Please note  You were cared for by a hospitalist during your hospital stay. Once you are discharged, your primary care physician will handle any further medical issues. Please note that NO REFILLS for any discharge medications will be authorized once you are discharged, as it is imperative that you return to your primary care physician (or establish a relationship with a primary care physician if you do not have one) for your aftercare needs so that they can reassess your need for medications and monitor your lab values.    Medication List    TAKE these medications        amLODipine 5 MG tablet  Commonly known as:  NORVASC  Take  1 tablet (5 mg total) by mouth daily.     furosemide 40 MG tablet  Commonly known as:  LASIX  Take 1 tablet (40 mg total) by mouth daily as needed for fluid or edema (weight gain > 3-4 lbs in 1-2 days).     metFORMIN 500 MG tablet  Commonly known as:  GLUCOPHAGE  Take 1 tablet (500 mg total) by mouth 2 (two) times daily with a meal.     potassium chloride 10 MEQ tablet  Commonly known as:  K-DUR  Take 2 tablets (20 mEq total) by mouth daily as needed (if you take Furosemide (Lasix)).           Follow-up Information    Follow up with [facility] On 09/23/2015.   Why:  at 12:00. Please arrive 15 minutes prior to your appointment and bring photo ID. If you cannot make appointment you must call to reschedule or cancel.   Contact information:   [address] [place] [address] [phone]      Follow up with [facility].   Why:  home oxygen   Contact information:   [address] [place] [address] [phone]       The results of significant diagnostics from this hospitalization (including imaging, microbiology, ancillary and laboratory) are listed below  for reference.    Significant Diagnostic Studies: Ct Angio Chest Pe W/cm &/or Wo Cm  09/08/2015  CLINICAL DATA:  Shortness of breath, cough, extremity edema, hypoxia and chest pain. EXAM: CT ANGIOGRAPHY CHEST WITH CONTRAST TECHNIQUE: Multidetector CT imaging of the chest was performed using the standard protocol during bolus administration of intravenous contrast. Multiplanar CT image reconstructions and MIPs were obtained to evaluate the vascular anatomy. CONTRAST:  OMNIPAQUE IOHEXOL 350 MG/ML SOLN COMPARISON:  Chest x-ray earlier today. FINDINGS: The pulmonary arteries are adequately opacified. There is no evidence of pulmonary embolism. The thoracic aorta is normal in caliber and shows no evidence of aneurysmal disease or dissection. The  heart size is at the upper limits of normal. There is suggestion of potential subtle calcified plaque in the distribution of the LAD. No pleural or pericardial fluid identified. Both posterior lower lobes demonstrates streaky opacities likely consistent with atelectasis. Component of pneumonia would be difficult to exclude. No evidence of pulmonary edema, nodule, airway obstruction or pneumothorax. No enlarged lymph nodes are seen. Visualized upper abdominal structures are unremarkable. Bony structures show a severe leftward convex scoliosis of the upper thoracic spine and compensatory rightward curvature in the lower thoracic and upper lumbar spine. Review of the MIP images confirms the above findings. IMPRESSION: 1. No evidence of pulmonary embolism. 2. Prominent atelectasis in both lower lobes. Component of pneumonia would be difficult to exclude. 3. Suggestion of subtle calcified plaque in the distribution of the left anterior descending coronary artery. 4. Severe scoliosis. Electronically Signed   By: Irish Lack M.D.   On: 09/08/2015 12:03   Dg Chest Port 1 View  09/10/2015  CLINICAL DATA:  Respiratory failure, shortness breath EXAM: PORTABLE CHEST 1 VIEW COMPARISON:  Portable chest x-ray of September 09, 2015 FINDINGS: The lungs are adequately inflated. The interstitial markings remain increased. Confluent densities at the lung bases persist and likely reflect pleural fluid layering posteriorly. The cardiac silhouette remains enlarged. The pulmonary vascularity is indistinct. The endotracheal tube tip lies 3.4 cm above the carina. The esophagogastric tube tip projects below the inferior margin of the image. IMPRESSION: Stable appearance of chest since yesterday's study. Findings are consistent with CHF and pulmonary interstitial edema with posterior layering pleural effusions. Bibasilar atelectasis or infiltrate may be present. Electronically Signed   By: David  [place] M.D.   On: 09/10/2015 07:04   Dg  Chest Port 1 View  09/09/2015  CLINICAL DATA:  Respiratory failure. EXAM: PORTABLE CHEST 1 VIEW COMPARISON:  09/08/2015. FINDINGS: Endotracheal tube in stable position. Interval placement of NG tube, its tip appears to be below the left hemidiaphragm. The lower left hemithorax is not imaged. Cardiomegaly bilateral pulmonary infiltrates suggesting congestive heart failure. Bilateral pneumonia cannot be excluded. No prominent pleural effusion. No pneumothorax. IMPRESSION: 1. Placement NG tube, its tip appears to be below the left hemidiaphragm. However the lower left hemithorax is not imaged. Endotracheal tube in stable position. 2. Cardiomegaly with bilateral pulmonary infiltrates suggest congestive heart failure. Bilateral pneumonia cannot be excluded. Electronically Signed   By: Maisie Fus  Register   On: 09/09/2015 07:09   Dg Chest Port 1 View  09/08/2015  CLINICAL DATA:  Post intubation EXAM: PORTABLE CHEST 1 VIEW COMPARISON:  CT of the chest same day FINDINGS: Cardiomegaly. Dextroscoliosis lower thoracic and upper lumbar spine. Central vascular congestion without pulmonary edema. Bilateral basilar atelectasis or infiltrate. Endotracheal tube in place with tip 3. 5 cm above the carina. No pneumothorax. IMPRESSION: Endotracheal tube in place. No pneumothorax.  Bilateral basilar atelectasis or infiltrate. Electronically Signed   By: Natasha MeadLiviu  Pop M.D.   On: 09/08/2015 13:27   Dg Chest Port 1 View  09/08/2015  CLINICAL DATA:  Cough and shortness of breath, 2-3 weeks duration. EXAM: PORTABLE CHEST 1 VIEW COMPARISON:  None. FINDINGS: There is thoracic deformity related to spinal curvature. Heart size is normal. Mediastinal shadows are normal allowing for the spinal curvature. There is patchy infiltrate in both lung bases, left worse than right, consistent with basilar pneumonia. No dense consolidation or lobar collapse. No effusion. IMPRESSION: Thoracic deformity related to spinal curvature. Basilar pneumonia left  more than right. Electronically Signed   By: Paulina FusiMark  Shogry M.D.   On: 09/08/2015 10:15    Microbiology: Recent Results (from the past 240 hour(s))  Culture, blood (routine x 2)     Status: None   Collection Time: 09/08/15 10:30 AM  Result Value Ref Range Status   Specimen Description BLOOD RIGHT ANTECUBITAL  Final   Special Requests BOTTLES DRAWN AEROBIC AND ANAEROBIC 10CC  Final   Culture   Final    NO GROWTH 5 DAYS [facility]formed [facility]    Report Status 09/13/2015 FINAL  Final  Culture, blood (routine x 2)     Status: None   Collection Time: 09/08/15 10:40 AM  Result Value Ref Range Status   Specimen Description BLOOD RIGHT HAND  Final   Special Requests BOTTLES DRAWN AEROBIC AND ANAEROBIC  10 CC  Final   Culture   Final    NO GROWTH 5 DAYS [facility]    Report Status 09/13/2015 FINAL  Final  MRSA PCR Screening     Status: None   Collection Time: 09/08/15  3:35 PM  Result Value Ref Range Status   MRSA by PCR NEGATIVE NEGATIVE Final    Comment:        The GeneXpert MRSA Assay (FDA approved for NASAL specimens only), is one component of a comprehensive MRSA colonization surveillance program. It is not intended to diagnose MRSA infection nor to guide or monitor treatment for MRSA infections.      Labs: Basic Metabolic Panel:  Recent Labs Lab 09/08/15 0945 09/09/15 0347 09/09/15 1235 09/09/15 2319 09/10/15 0544 09/10/15 1020 09/10/15 2323 09/11/15 0239 09/11/15 1203 09/11/15 2335 09/12/15 0247 09/13/15 0728  NA 142 143  --   --  144  --   --  143  --   --  143 141  K 3.8 3.3*  --   --  3.9  --   --  3.2*  --   --  3.5 4.0  CL 101 103  --   --  99*  --   --  104  --   --  99* 101  CO2 36* 31  --   --  35*  --   --  34*  --   --  35* 32  GLUCOSE 185* 158*  --   --  121*  --   --  160*  --   --  105* 119*  BUN 21* 16  --   --  22*  --   --  21*  --   --  20 18  CREATININE 1.08 1.18  --   --  1.38*  --   --  1.05  --   --   1.17 1.00  CALCIUM 8.6* 8.5*  --   --  8.7*  --   --  8.5*  --   --  8.7* 8.7*  MG  --  2.2 2.3 2.2 2.5* 2.2 2.4  --  2.4 2.4  --   --   PHOS  --  2.5 3.4 5.2* 5.9* 5.8* 4.6  --  4.7* 4.4  --   --    Liver Function Tests:  Recent Labs Lab 09/08/15 0945  AST 73*  ALT 153*  ALKPHOS 65  BILITOT 0.6  PROT 6.6  ALBUMIN 3.5    Recent Labs Lab 09/08/15 0945  LIPASE 26   CBC:  Recent Labs Lab 09/08/15 0945 09/09/15 0347 09/10/15 1020 09/11/15 1203 09/12/15 0247  WBC 7.9 8.2 6.5 8.9 7.3  NEUTROABS 5.9  --   --   --   --   HGB 15.6 15.0 14.6 15.6 14.7  HCT 50.3 46.5 45.9 48.9 47.5  MCV 99.6 95.7 97.2 97.2 99.2  PLT 278 271 269 295 264   Cardiac Enzymes:  Recent Labs Lab 09/08/15 0945 09/08/15 1628 09/08/15 2225 09/09/15 0347  TROPONINI 0.08* 0.06* <0.03 0.03   BNP: BNP (last 3 results)  Recent Labs  09/08/15 0945  BNP 130.3*     CBG:  Recent Labs Lab 09/13/15 1429 09/13/15 1649 09/13/15 2106 09/14/15 0751 09/14/15 1256  GLUCAP 209* 210* 138* 147* 107*    Signed:  Yeni Jiggetts  [facility] 09/14/2015, 2:41 PM

## 2015-09-14 NOTE — Progress Notes (Signed)
NURSING PROGRESS NOTE  Shane Hinton [id number] Discharge Data: 09/14/2015 3:35 PM Attending Provider: No att. providers found PCP:No PCP Per Patient   Shane Hinton to be D/C'd Home per MD order with home oxygen that was delivered to room. Patient verbalized understanding of how to use home O2 by the representative that dropped off the oxygen. All prescriptions sent to patient preferred pharmacy.    All IV's will be discontinued and monitored for bleeding.  All belongings will be returned to patient for patient to take home.  Last Documented Vital Signs:  Blood pressure 152/84, pulse 95, temperature 98.6 F (37 C), temperature source Oral, resp. rate 18, height 5\' 9"  (1.753 m), weight 122.018 kg (269 lb), SpO2 92 %.  Leane PlattSpencer Kota Ciancio RN, BS, BSN

## 2015-09-14 NOTE — Progress Notes (Signed)
Patient oxygen tank has been delivered to room, patient states his ride will be here around 4-5pm.

## 2015-09-15 LAB — HEMOGLOBIN A1C
Hgb A1c MFr Bld: 6.5 % — ABNORMAL HIGH (ref 4.8–5.6)
Mean Plasma Glucose: 140 mg/dL

## 2015-09-23 ENCOUNTER — Ambulatory Visit: Payer: Medicaid Other | Attending: Family Medicine | Admitting: Family Medicine

## 2015-09-23 ENCOUNTER — Encounter: Payer: Self-pay | Admitting: Family Medicine

## 2015-09-23 ENCOUNTER — Ambulatory Visit: Payer: Self-pay | Admitting: Pharmacist

## 2015-09-23 VITALS — BP 149/98 | HR 97 | Temp 98.5°F | Resp 18 | Ht 68.0 in | Wt 272.0 lb

## 2015-09-23 DIAGNOSIS — G4730 Sleep apnea, unspecified: Secondary | ICD-10-CM | POA: Insufficient documentation

## 2015-09-23 DIAGNOSIS — M549 Dorsalgia, unspecified: Secondary | ICD-10-CM

## 2015-09-23 DIAGNOSIS — E669 Obesity, unspecified: Secondary | ICD-10-CM | POA: Insufficient documentation

## 2015-09-23 DIAGNOSIS — E119 Type 2 diabetes mellitus without complications: Secondary | ICD-10-CM

## 2015-09-23 DIAGNOSIS — M419 Scoliosis, unspecified: Secondary | ICD-10-CM | POA: Insufficient documentation

## 2015-09-23 DIAGNOSIS — G8929 Other chronic pain: Secondary | ICD-10-CM | POA: Insufficient documentation

## 2015-09-23 DIAGNOSIS — Z8249 Family history of ischemic heart disease and other diseases of the circulatory system: Secondary | ICD-10-CM | POA: Diagnosis not present

## 2015-09-23 DIAGNOSIS — I5033 Acute on chronic diastolic (congestive) heart failure: Secondary | ICD-10-CM

## 2015-09-23 DIAGNOSIS — I110 Hypertensive heart disease with heart failure: Secondary | ICD-10-CM | POA: Diagnosis not present

## 2015-09-23 DIAGNOSIS — J9602 Acute respiratory failure with hypercapnia: Secondary | ICD-10-CM

## 2015-09-23 DIAGNOSIS — J9600 Acute respiratory failure, unspecified whether with hypoxia or hypercapnia: Secondary | ICD-10-CM | POA: Insufficient documentation

## 2015-09-23 DIAGNOSIS — I5031 Acute diastolic (congestive) heart failure: Secondary | ICD-10-CM | POA: Insufficient documentation

## 2015-09-23 DIAGNOSIS — Z841 Family history of disorders of kidney and ureter: Secondary | ICD-10-CM | POA: Diagnosis not present

## 2015-09-23 DIAGNOSIS — E785 Hyperlipidemia, unspecified: Secondary | ICD-10-CM | POA: Insufficient documentation

## 2015-09-23 DIAGNOSIS — R05 Cough: Secondary | ICD-10-CM | POA: Insufficient documentation

## 2015-09-23 DIAGNOSIS — J9601 Acute respiratory failure with hypoxia: Secondary | ICD-10-CM

## 2015-09-23 DIAGNOSIS — I10 Essential (primary) hypertension: Secondary | ICD-10-CM | POA: Insufficient documentation

## 2015-09-23 DIAGNOSIS — R739 Hyperglycemia, unspecified: Secondary | ICD-10-CM

## 2015-09-23 DIAGNOSIS — Z833 Family history of diabetes mellitus: Secondary | ICD-10-CM | POA: Insufficient documentation

## 2015-09-23 DIAGNOSIS — J189 Pneumonia, unspecified organism: Secondary | ICD-10-CM | POA: Insufficient documentation

## 2015-09-23 DIAGNOSIS — N179 Acute kidney failure, unspecified: Secondary | ICD-10-CM

## 2015-09-23 DIAGNOSIS — R059 Cough, unspecified: Secondary | ICD-10-CM | POA: Insufficient documentation

## 2015-09-23 HISTORY — DX: Type 2 diabetes mellitus without complications: E11.9

## 2015-09-23 LAB — GLUCOSE, POCT (MANUAL RESULT ENTRY): POC GLUCOSE: 130 mg/dL — AB (ref 70–99)

## 2015-09-23 MED ORDER — METOPROLOL TARTRATE 25 MG PO TABS
25.0000 mg | ORAL_TABLET | Freq: Two times a day (BID) | ORAL | Status: DC
Start: 2015-09-23 — End: 2015-12-05

## 2015-09-23 MED ORDER — TRUEPLUS LANCETS 28G MISC: 1.0000 | Freq: Every day | Status: AC

## 2015-09-23 MED ORDER — LISINOPRIL 2.5 MG PO TABS
2.5000 mg | ORAL_TABLET | Freq: Every day | ORAL | Status: DC
Start: 2015-09-23 — End: 2015-10-31

## 2015-09-23 MED ORDER — FUROSEMIDE 40 MG PO TABS: 40.0000 mg | ORAL_TABLET | Freq: Every day | ORAL | Status: DC

## 2015-09-23 MED ORDER — POTASSIUM CHLORIDE ER 20 MEQ PO TBCR: 20.0000 meq | EXTENDED_RELEASE_TABLET | Freq: Every day | ORAL | Status: DC | PRN

## 2015-09-23 MED ORDER — GLUCOSE BLOOD VI STRP: ORAL_STRIP | Status: AC

## 2015-09-23 MED ORDER — TRUE METRIX METER DEVI: 1.0000 | Freq: Every day | Status: AC

## 2015-09-23 NOTE — Progress Notes (Signed)
Pt's here for hospital f/up for respiratory failure. Pt states that he's feeling okay today and denies any pain.   Pt states he didn't  take meds due to financial hardship, and reports it's been weeks since he's taken his medication.  Pt requesting med refills.  Pt requesting a PCP with our clinic.

## 2015-09-23 NOTE — Progress Notes (Signed)
CC: Follow-up from hospitalization (09/08/15-09/14/15)  HPI: Shane Hinton is a 54 y.o. male with a history of obesity who had [facility] with shortness of breath and pedal edema and was found to have declining respiratory status requiring intubation and so was transferred to [facility].  He had a CT angiogram which was negative for PE, 2-D echo revealed EF of 65-70% grade 1 diastolic dysfunction. He was commenced on IV Lasix with resulting diuresis and decrease in weight from 290 pounds to 269 pounds. He was also noted to have elevated creatinine of up to 1.38 which trended down with diuresis. He had an A1c done which came back at 6.5 and he was commenced on metformin Hypercarbic/hypoxemic respiratory failure was thought to be secondary to obstructive sleep apnea versus obesity hypoventilation syndrome; he was subsequently extubated and placed on oxygen. His condition gradually improved and he was subsequently discharged home on amlodipine, Lasix, potassium, metformin and oxygen.  Interval history: He is yet to pick up his medications due to financial constraints and complaints of feeling Dizzy and dyspneic. He was in a hurry to get to his appointment today and so did not bring his oxygen tank along with him.  No Known Allergies Past Medical History  Diagnosis Date  . Hypertension    Current Outpatient Prescriptions on File Prior to Visit  Medication Sig Dispense Refill  . amLODipine (NORVASC) 5 MG tablet Take 1 tablet (5 mg total) by mouth daily. 30 tablet 1  . furosemide (LASIX) 40 MG tablet Take 1 tablet (40 mg total) by mouth daily as needed for fluid or edema (weight gain > 3-4 lbs in 1-2 days). 30 tablet 0  . potassium chloride (K-DUR) 10 MEQ tablet Take 2 tablets (20 mEq total) by mouth daily as needed (if you take Furosemide (Lasix)). 20 tablet 0   No current facility-administered medications on file prior to visit.   Family History  Problem  Relation Age of Onset  . Diabetes Mother   . Hypertension Mother   . Kidney disease Mother    Social History   Social History  . Marital Status: Legally Separated    Spouse Name: N/A  . Number of Children: N/A  . Years of Education: N/A   Occupational History  . Not on file.   Social History Main Topics  . Smoking status: Never Smoker   . Smokeless tobacco: Not on file  . Alcohol Use: No  . Drug Use: No  . Sexual Activity: Not on file   Other Topics Concern  . Not on file   Social History Narrative    Review of Systems: Constitutional: Negative for fever, chills, diaphoresis, activity change, appetite change and fatigue. HENT: Negative for ear pain, nosebleeds, congestion, facial swelling, rhinorrhea, neck pain, neck stiffness and ear discharge.  Eyes: Negative for pain, discharge, redness, itching and visual disturbance. Respiratory: Negative for cough, choking, chest tightness, shortness of breath, wheezing and stridor.  Cardiovascular: Negative for chest pain, palpitations and leg swelling. Gastrointestinal: Negative for abdominal distention. Genitourinary: Negative for dysuria, urgency, frequency, hematuria, flank pain, decreased urine volume, difficulty urinating and dyspareunia.  Musculoskeletal: Negative for back pain, joint swelling, arthralgias and gait problem. Neurological: Negative for dizziness, tremors, seizures, syncope, facial asymmetry, speech difficulty, weakness, light-headedness, numbness and headaches.  Hematological: Negative for adenopathy. Does not bruise/bleed easily. Psychiatric/Behavioral: Negative for hallucinations, behavioral problems, confusion, dysphoric mood, decreased concentration and agitation.    Objective:   Filed Vitals:  09/23/15 1212  BP: 149/98  Pulse: 97  Temp: 98.5 F (36.9 C)  Resp: 18    Physical Exam: Constitutional: Patient appears well-developed and well-nourished. No distress. HENT: Normocephalic, atraumatic,  External right and left ear normal. Oropharynx is clear and moist.  Eyes: Conjunctivae and EOM are normal. PERRLA, no scleral icterus. Neck: Normal ROM. Neck supple. + JVD. No tracheal deviation. No thyromegaly. CVS: RRR, S1/S2 +, no murmurs, no gallops, no carotid bruit.  Pulmonary: Effort and breath sounds normal, no stridor, rhonchi, wheezes, rales.  Abdominal: Soft. BS +,  no distension, tenderness, rebound or guarding.  Musculoskeletal: Normal range of motion. 1+ bilateral pedal edema  Lymphadenopathy: No lymphadenopathy noted, cervical, inguinal or axillary Neuro: Alert. Normal reflexes, muscle tone coordination. No cranial nerve deficit. Skin: Skin is warm and dry. No rash noted. Not diaphoretic. No erythema. No pallor. Psychiatric: Normal mood and affect. Behavior, judgment, thought content normal.  Lab Results  Component Value Date   WBC 7.3 09/12/2015   HGB 14.7 09/12/2015   HCT 47.5 09/12/2015   MCV 99.2 09/12/2015   PLT 264 09/12/2015   Lab Results  Component Value Date   CREATININE 1.00 09/13/2015   BUN 18 09/13/2015   NA 141 09/13/2015   K 4.0 09/13/2015   CL 101 09/13/2015   CO2 32 09/13/2015    Lab Results  Component Value Date   HGBA1C 6.5* 09/13/2015   Lipid Panel     Component Value Date/Time   TRIG 99 09/13/2015 0506   Filed Weights   09/23/15 1212  Weight: 272 lb (123.378 kg)       Assessment and plan:  Acute diastolic heart failure: He currently has signs of fluid overload with elevated JVD and pedal edema due to not picking up his medications. BNP sent off and medications have been sent to the pharmacy on site which will be cost effective for him. I have discontinued amlodipine which he was placed on an replaced this with metipranolol and low-dose lisinopril. Educated on daily weight checks, limit fluids to 2 L per day, low-sodium heart healthy diet.  Acute respiratory failure: His oxygen saturation is low off oxygen. Advised to continue  using oxygen at rest. He will need a sleep study to evaluate for sleep apnea after acute episode has resolved.  Type 2 diabetes mellitus: Newly diagnosed with A1c of 6.5. I have discontinued metformin due to increased risk of lactic acidosis. He will remain on dietary control. Clinical pharmacist called in for education and testing supplies written. I'll review his blood sugar log in 2 weeks.  Acute kidney injury: Resolved. Discharge creatinine was 1.0 We will repeat complete metabolic panel at next office visit after he has commenced his Lasix and potassium  Jaclyn Shaggy, MD. [facility] [phone] 09/23/2015, 12:37 PM

## 2015-09-23 NOTE — Patient Instructions (Signed)

## 2015-09-23 NOTE — Progress Notes (Signed)
S:    Patient arrives for visit with Dr. Venetia NightAmao and I was consulted to provide diabetic education.  Patient was previously on metformin but this has been discontinued due to acute heart failure exacerbation.   Patient denies hypoglycemic events.  Patient reported dietary habits: he is unable to control what he eats and has to eat what is provided to him.   Patient reported exercise habits: none   O:  Lab Results  Component Value Date   HGBA1C 6.5* 09/13/2015    A/P: Newly diagnosed diabetes with A1c controlled (<7%).   Patient denies hypoglycemic events and is able to verbalize appropriate hypoglycemia management plan.  Patient denies adherence with medication due to cost - all medications have been sent to our pharmacy to help with cost. Control is suboptimal due to sedentary lifestyle, dietary indiscretion, and comorbidities.  Per Dr. Venetia NightAmao, will not start any medications for diabetes at this time and focus on lifestyle adjustments and blood glucose monitoring. Educated patient on diabetes, including what an A1c was, the complications of diabetes, and the s/sx of hypo and hyperglycemia. Also educated patient on the use of the blood glucose meter. Instructed patient to return to see me if he was unable to use his blood glucose meter once he picked it up. Patient verbalized understanding.   Next A1C anticipated February 2017.    Written patient instructions provided.  Total time in face to face counseling 20 minutes.   Follow up in Pharmacist Clinic Visit as needed.

## 2015-09-24 ENCOUNTER — Telehealth: Payer: Self-pay | Admitting: Clinical

## 2015-09-24 LAB — MICROALBUMIN / CREATININE URINE RATIO
CREATININE, URINE: 19 mg/dL — AB (ref 20–370)
Microalb, Ur: <0.2 mg/dL

## 2015-09-24 LAB — BRAIN NATRIURETIC PEPTIDE: Brain Natriuretic Peptide: 7.8 pg/mL (ref 0.0–100.0)

## 2015-09-24 NOTE — Telephone Encounter (Signed)
Attempt at f/u with Mr. Shane Hinton, "voicemail not set up", no message left

## 2015-09-30 ENCOUNTER — Telehealth: Payer: Self-pay

## 2015-09-30 NOTE — Telephone Encounter (Signed)
-----   Message from Jaclyn ShaggyEnobong Amao, MD sent at 09/25/2015  8:16 AM EST ----- Please inform the patient that labs are normal. Thank you.

## 2015-09-30 NOTE — Telephone Encounter (Signed)
CMA called patient, patient verified name and DOB. Patient was given lab results verbalized that he understood. Patient had a question about a cluster of bumps on his leg that was red with white discharge and painful when he squeeze it. I advised the patient to stop messing with it because it could be something contagious. I suggested that if it gets worse then to seek medical attention at a Urgent care if sxs persist. Patient stated that he would.

## 2015-10-10 ENCOUNTER — Encounter: Payer: Self-pay | Admitting: Family Medicine

## 2015-10-10 ENCOUNTER — Ambulatory Visit: Payer: Medicaid Other | Attending: Family Medicine | Admitting: Family Medicine

## 2015-10-10 VITALS — BP 148/82 | HR 88 | Temp 98.1°F | Resp 18 | Ht 68.0 in | Wt 268.0 lb

## 2015-10-10 DIAGNOSIS — I10 Essential (primary) hypertension: Secondary | ICD-10-CM | POA: Diagnosis not present

## 2015-10-10 DIAGNOSIS — R0902 Hypoxemia: Secondary | ICD-10-CM

## 2015-10-10 DIAGNOSIS — I5033 Acute on chronic diastolic (congestive) heart failure: Secondary | ICD-10-CM | POA: Diagnosis not present

## 2015-10-10 DIAGNOSIS — E662 Morbid (severe) obesity with alveolar hypoventilation: Secondary | ICD-10-CM | POA: Diagnosis not present

## 2015-10-10 DIAGNOSIS — Z79899 Other long term (current) drug therapy: Secondary | ICD-10-CM | POA: Insufficient documentation

## 2015-10-10 DIAGNOSIS — I5032 Chronic diastolic (congestive) heart failure: Secondary | ICD-10-CM | POA: Insufficient documentation

## 2015-10-10 DIAGNOSIS — E119 Type 2 diabetes mellitus without complications: Secondary | ICD-10-CM | POA: Diagnosis not present

## 2015-10-10 DIAGNOSIS — G4733 Obstructive sleep apnea (adult) (pediatric): Secondary | ICD-10-CM | POA: Diagnosis not present

## 2015-10-10 LAB — BASIC METABOLIC PANEL
BUN: 14 mg/dL (ref 7–25)
CALCIUM: 9.5 mg/dL (ref 8.6–10.3)
CO2: 33 mmol/L — AB (ref 20–31)
CREATININE: 1.08 mg/dL (ref 0.70–1.33)
Chloride: 104 mmol/L (ref 98–110)
GLUCOSE: 140 mg/dL — AB (ref 65–99)
Potassium: 3.9 mmol/L (ref 3.5–5.3)
Sodium: 144 mmol/L (ref 135–146)

## 2015-10-10 LAB — GLUCOSE, POCT (MANUAL RESULT ENTRY): POC Glucose: 157 mg/dl — AB (ref 70–99)

## 2015-10-10 NOTE — Progress Notes (Signed)
Subjective:    Patient ID: Shane Hinton, male    DOB: Mar 20, 1961, 54 y.o.   MRN: [id number]  HPI  54 year old male with a history of acute on chronic diastolic heart failure (EF 65-70%), newly diagnosed type 2 diabetic (who is currently on diet control and his A1c is 6.5) recently hospitalized for respiratory failure secondary to obstructive sleep apnea versus obesity hypoventilation syndrome was commenced on oxygen during that admission.  Today he complains of feeling short of breath and his exercise tolerance is half a block even with the use of his oxygen at rest. He continues to have daytime somnolence and fatigue and does not sleep much at night. He has had blurry vision for the last 8-9 months and denies any worsening at this time.  He has his blood sugar log with him which reveals his random blood sugars have been in the range of 89-178.  Past Medical History  Diagnosis Date  . Hypertension     History reviewed. No pertinent past surgical history.  Social History   Social History  . Marital Status: Legally Separated    Spouse Name: N/A  . Number of Children: N/A  . Years of Education: N/A   Occupational History  . Not on file.   Social History Main Topics  . Smoking status: Never Smoker   . Smokeless tobacco: Not on file  . Alcohol Use: No  . Drug Use: No  . Sexual Activity: Not on file   Other Topics Concern  . Not on file   Social History Narrative    No Known Allergies  Current Outpatient Prescriptions on File Prior to Visit  Medication Sig Dispense Refill  . Blood Glucose Monitoring Suppl (TRUE METRIX METER) DEVI 1 each by Does not apply route daily. 1 Device 0  . furosemide (LASIX) 40 MG tablet Take 1 tablet (40 mg total) by mouth daily. 30 tablet 1  . glucose blood (TRUE METRIX BLOOD GLUCOSE TEST) test strip Use as instructed 100 each 12  . lisinopril (PRINIVIL,ZESTRIL) 2.5 MG tablet Take 1 tablet (2.5 mg total) by mouth daily. 10 tablet 1  .  metoprolol tartrate (LOPRESSOR) 25 MG tablet Take 1 tablet (25 mg total) by mouth 2 (two) times daily. 180 tablet 3  . potassium chloride 20 MEQ TBCR Take 20 mEq by mouth daily as needed (if you take Furosemide (Lasix)). 30 tablet 1  . TRUEPLUS LANCETS 28G MISC 1 each by Does not apply route daily. 30 each 5   No current facility-administered medications on file prior to visit.     Review of Systems  Constitutional: Negative for fever, chills, diaphoresis, activity change, appetite change and positive for fatigue. HENT: Negative for ear pain, nosebleeds, congestion, facial swelling, rhinorrhea, neck pain, neck stiffness and ear discharge.  Eyes: Negative for pain, discharge, redness, itching and positive for visual disturbance. Respiratory: Negative for cough, choking, chest tightness, positive for shortness of breath, negative for wheezing and stridor.  Cardiovascular: Negative for chest pain, palpitations and leg swelling. Gastrointestinal: Negative for abdominal distention. Genitourinary: Negative for dysuria, urgency, frequency, hematuria, flank pain, decreased urine volume, difficulty urinating and dyspareunia.  Musculoskeletal: Negative for back pain, joint swelling, arthralgias and gait problem. Neurological: Negative for dizziness, tremors, seizures, syncope, facial asymmetry, speech difficulty, weakness, light-headedness, numbness and headaches.  Hematological: Negative for adenopathy. Does not bruise/bleed easily. Psychiatric/Behavioral: Negative for hallucinations, behavioral problems, confusion, dysphoric mood, decreased concentration and agitation.       Objective: Filed Vitals:  10/10/15 1532 10/10/15 1536  BP: 173/85 148/82  Pulse: 88   Temp: 98.1 F (36.7 C)   TempSrc: Oral   Resp: 18   Height: 5\' 8"  (1.727 m)   Weight: 268 lb (121.564 kg)   SpO2: 91%       Physical Exam  Constitutional: Patient is obese ,appears well-developed and well-nourished. No distress,  appears somnolent HENT: Normocephalic, atraumatic, External right and left ear normal. Oropharynx is clear and moist.  Eyes: Conjunctivae and EOM are normal. PERRLA, no scleral icterus. Neck: Normal ROM. Neck supple. no JVD. No tracheal deviation. No thyromegaly. CVS: RRR, S1/S2 +, no murmurs, no gallops, no carotid bruit.  Pulmonary: Effort and breath sounds normal, no stridor, rhonchi, wheezes, rales.  Abdominal: Soft. BS +,  no distension, tenderness, rebound or guarding.  Musculoskeletal: Normal range of motion. 1+ bilateral pedal edema  Lymphadenopathy: No lymphadenopathy noted, cervical, inguinal or axillary Neuro: Alert. Normal reflexes, muscle tone coordination. No cranial nerve deficit. Skin: Skin is warm and dry. No rash noted. Not diaphoretic. No erythema. No pallor. Psychiatric: Normal mood and affect. Behavior, judgment, thought content normal.     Recent Labs  09/08/15 0945  BNP 130.3*          Assessment & Plan:  Acute on chronic diastolic heart failure: EF 65-70% from 2-D echo of 09/2015 He has lost 4 pounds since his last visit 2 weeks ago Dyspnea most likely secondary to pulmonary process rather than a cardiac as his BNP is stable and so is his weight. Educated on daily weight checks, limit fluids to 2 L per day, low-sodium heart healthy diet.  Hypoxia: Likely due to obesity hypoventilation syndrome vs obstructive sleep apnea: His oxygen saturation is low at 91% on 2L oxygen Advised to continue using oxygen at rest; advised to increase his 3 L of oxygen He will need a sleep study to evaluate for sleep apnea but he is yet to apply for the [facility] discount which I have advised him to do as this will enable us place his referral.  Type 2 diabetes mellitus: Newly diagnosed with A1c of 6.5. He will remain on dietary control as his blood sugars reveal he is doing well.  Morbid obesity: He is unable to exercise due to his current exercise intolerance. I  have advised him to work on cutting back on portion sizes.  Hypertension: Controlled  This note has been created with Education officer, environmentalDragon speech recognition software and smart phrase technology. Any transcriptional errors are unintentional.

## 2015-10-10 NOTE — Patient Instructions (Signed)
Sleep Apnea  Sleep apnea is a sleep disorder characterized by abnormal pauses in breathing while you sleep. When your breathing pauses, the level of oxygen in your blood decreases. This causes you to move out of deep sleep and into light sleep. As a result, your quality of sleep is poor, and the system that carries your blood throughout your body (cardiovascular system) experiences stress. If sleep apnea remains untreated, the following conditions can develop:  High blood pressure (hypertension).  Coronary artery disease.  Inability to achieve or maintain an erection (impotence).  Impairment of your thought process (cognitive dysfunction). There are three types of sleep apnea: 1. Obstructive sleep apnea--Pauses in breathing during sleep because of a blocked airway. 2. Central sleep apnea--Pauses in breathing during sleep because the area of the brain that controls your breathing does not send the correct signals to the muscles that control breathing. 3. Mixed sleep apnea--A combination of both obstructive and central sleep apnea. RISK FACTORS The following risk factors can increase your risk of developing sleep apnea:  Being overweight.  Smoking.  Having narrow passages in your nose and throat.  Being of older age.  Being male.  Alcohol use.  Sedative and tranquilizer use.  Ethnicity. Among individuals younger than 35 years, African Americans are at increased risk of sleep apnea. SYMPTOMS   Difficulty staying asleep.  Daytime sleepiness and fatigue.  Loss of energy.  Irritability.  Loud, heavy snoring.  Morning headaches.  Trouble concentrating.  Forgetfulness.  Decreased interest in sex.  Unexplained sleepiness. DIAGNOSIS  In order to diagnose sleep apnea, your caregiver will perform a physical examination. A sleep study done in the comfort of your own home may be appropriate if you are otherwise healthy. Your caregiver may also recommend that you spend the  night in a sleep lab. In the sleep lab, several monitors record information about your heart, lungs, and brain while you sleep. Your leg and arm movements and blood oxygen level are also recorded. TREATMENT The following actions may help to resolve mild sleep apnea:  Sleeping on your side.   Using a decongestant if you have nasal congestion.   Avoiding the use of depressants, including alcohol, sedatives, and narcotics.   Losing weight and modifying your diet if you are overweight. There also are devices and treatments to help open your airway:  Oral appliances. These are custom-made mouthpieces that shift your lower jaw forward and slightly open your bite. This opens your airway.  Devices that create positive airway pressure. This positive pressure "splints" your airway open to help you breathe better during sleep. The following devices create positive airway pressure:  Continuous positive airway pressure (CPAP) device. The CPAP device creates a continuous level of air pressure with an air pump. The air is delivered to your airway through a mask while you sleep. This continuous pressure keeps your airway open.  Nasal expiratory positive airway pressure (EPAP) device. The EPAP device creates positive air pressure as you exhale. The device consists of single-use valves, which are inserted into each nostril and held in place by adhesive. The valves create very little resistance when you inhale but create much more resistance when you exhale. That increased resistance creates the positive airway pressure. This positive pressure while you exhale keeps your airway open, making it easier to breath when you inhale again.  Bilevel positive airway pressure (BPAP) device. The BPAP device is used mainly in patients with central sleep apnea. This device is similar to the CPAP device because   it also uses an air pump to deliver continuous air pressure through a mask. However, with the BPAP machine, the  pressure is set at two different levels. The pressure when you exhale is lower than the pressure when you inhale.  Surgery. Typically, surgery is only done if you cannot comply with less invasive treatments or if the less invasive treatments do not improve your condition. Surgery involves removing excess tissue in your airway to create a wider passage way.   This information is not intended to replace advice given to you by your health care provider. Make sure you discuss any questions you have with your health care provider.   Document Released: 10/15/2002 Document Revised: 11/15/2014 Document Reviewed: 03/02/2012 Elsevier Interactive Patient Education 2016 [employer]   

## 2015-10-10 NOTE — Progress Notes (Signed)
Pt's here for f/up DM. Patient states that he's feeling, but c/o leg pain described as pin and needles patient denies pain.   He states he's having  blurred vision, chest pain on and off. SOB and fatigue.

## 2015-10-13 ENCOUNTER — Telehealth: Payer: Self-pay | Admitting: *Deleted

## 2015-10-13 NOTE — Telephone Encounter (Signed)
-----   Message from Jaclyn ShaggyEnobong Amao, MD sent at 10/13/2015  9:19 AM EST ----- Please inform him his labs are stable.

## 2015-10-13 NOTE — Telephone Encounter (Signed)
Verified name and date of birth.  RN told patient his lab results were stable.  Patient verbalized understanding.

## 2015-10-31 ENCOUNTER — Other Ambulatory Visit: Payer: Self-pay

## 2015-10-31 ENCOUNTER — Encounter: Payer: Self-pay | Admitting: Family Medicine

## 2015-10-31 ENCOUNTER — Ambulatory Visit: Payer: Medicaid Other | Attending: Family Medicine | Admitting: Family Medicine

## 2015-10-31 VITALS — BP 176/85 | HR 85 | Temp 98.3°F | Resp 18 | Ht 68.0 in | Wt 271.0 lb

## 2015-10-31 DIAGNOSIS — Z6841 Body Mass Index (BMI) 40.0 and over, adult: Secondary | ICD-10-CM | POA: Insufficient documentation

## 2015-10-31 DIAGNOSIS — E1169 Type 2 diabetes mellitus with other specified complication: Secondary | ICD-10-CM | POA: Diagnosis present

## 2015-10-31 DIAGNOSIS — I5033 Acute on chronic diastolic (congestive) heart failure: Secondary | ICD-10-CM

## 2015-10-31 DIAGNOSIS — E662 Morbid (severe) obesity with alveolar hypoventilation: Secondary | ICD-10-CM | POA: Diagnosis not present

## 2015-10-31 DIAGNOSIS — Z79899 Other long term (current) drug therapy: Secondary | ICD-10-CM | POA: Insufficient documentation

## 2015-10-31 DIAGNOSIS — R079 Chest pain, unspecified: Secondary | ICD-10-CM | POA: Insufficient documentation

## 2015-10-31 DIAGNOSIS — R0902 Hypoxemia: Secondary | ICD-10-CM | POA: Diagnosis not present

## 2015-10-31 DIAGNOSIS — I10 Essential (primary) hypertension: Secondary | ICD-10-CM

## 2015-10-31 DIAGNOSIS — G4733 Obstructive sleep apnea (adult) (pediatric): Secondary | ICD-10-CM

## 2015-10-31 LAB — GLUCOSE, POCT (MANUAL RESULT ENTRY): POC GLUCOSE: 136 mg/dL — AB (ref 70–99)

## 2015-10-31 MED ORDER — LISINOPRIL 10 MG PO TABS: 10.0000 mg | ORAL_TABLET | Freq: Every day | ORAL | Status: DC

## 2015-10-31 MED ORDER — ASPIRIN EC 81 MG PO TBEC: 81.0000 mg | DELAYED_RELEASE_TABLET | Freq: Every day | ORAL | Status: AC

## 2015-10-31 MED ORDER — NITROGLYCERIN 0.4 MG SL SUBL: 0.4000 mg | SUBLINGUAL_TABLET | SUBLINGUAL | Status: AC | PRN

## 2015-10-31 NOTE — Progress Notes (Signed)
Subjective:    Patient ID: Shane Hinton, male    DOB: 07/18/1961, 54 y.o.   MRN: [id number]  HPI 54 year old male with a history of acute on chronic diastolic heart failure (EF 65-70%), newly diagnosed type 2 diabetic (who is currently on diet control and his A1c is 6.5) recently hospitalized for respiratory failure secondary to obstructive sleep apnea versus obesity hypoventilation syndrome was commenced on oxygen during that admission.  He continues to complain of feeling short of breath and his exercise tolerance is half a block even with the use of his oxygen at rest, he has no pedal edema. He continues to have daytime somnolence and fatigue and does not sleep much at night. Also states he has intermittent chest pain which is unrelated to activity. Blood pressure is elevated despite his compliance with his antihypertensive.  He has been unable to [facility] which we had discussed at his last visit as this would facilitate his referral to the sleep center for sleep study to exclude or include a diagnosis of obstructive sleep apnea.  Past Medical History  Diagnosis Date  . Hypertension     History reviewed. No pertinent past surgical history.  Social History   Social History  . Marital Status: Legally Separated    Spouse Name: N/A  . Number of Children: N/A  . Years of Education: N/A   Occupational History  . Not on file.   Social History Main Topics  . Smoking status: Never Smoker   . Smokeless tobacco: Not on file  . Alcohol Use: No  . Drug Use: No  . Sexual Activity: Not on file   Other Topics Concern  . Not on file   Social History Narrative    No Known Allergies  Current Outpatient Prescriptions on File Prior to Visit  Medication Sig Dispense Refill  . Blood Glucose Monitoring Suppl (TRUE METRIX METER) DEVI 1 each by Does not apply route daily. 1 Device 0  . furosemide (LASIX) 40 MG tablet Take 1 tablet (40 mg total) by mouth daily.  30 tablet 1  . glucose blood (TRUE METRIX BLOOD GLUCOSE TEST) test strip Use as instructed 100 each 12  . metoprolol tartrate (LOPRESSOR) 25 MG tablet Take 1 tablet (25 mg total) by mouth 2 (two) times daily. 180 tablet 3  . potassium chloride 20 MEQ TBCR Take 20 mEq by mouth daily as needed (if you take Furosemide (Lasix)). 30 tablet 1  . TRUEPLUS LANCETS 28G MISC 1 each by Does not apply route daily. 30 each 5   No current facility-administered medications on file prior to visit.      Review of Systems Constitutional: Negative for fever, chills, diaphoresis, activity change, appetite change and positive for fatigue. HENT: Negative for ear pain, nosebleeds, congestion, facial swelling, rhinorrhea, neck pain, neck stiffness and ear discharge.  Eyes: Negative for pain, discharge, redness, itching and positive for visual disturbance. Respiratory: Negative for cough, choking, chest tightness, positive for shortness of breath, negative for wheezing and stridor.  Cardiovascular: positive for intermittent chest pain, negative for palpitations and leg swelling. Gastrointestinal: Negative for abdominal distention. Genitourinary: Negative for dysuria, urgency, frequency, hematuria, flank pain, decreased urine volume, difficulty urinating and dyspareunia.  Musculoskeletal: Negative for back pain, joint swelling, arthralgias and gait problem. Neurological: Negative for dizziness, tremors, seizures, syncope, facial asymmetry, speech difficulty, weakness, light-headedness, numbness and headaches.  Hematological: Negative for adenopathy. Does not bruise/bleed easily. Psychiatric/Behavioral: Negative for hallucinations, behavioral problems, confusion, dysphoric mood,  decreased concentration and agitation.     Objective: Filed Vitals:   10/31/15 1035  BP: 176/85  Pulse: 85  Temp: 98.3 F (36.8 C)  TempSrc: Oral  Resp: 18  Height:  (1.727 m)  Weight: 271 lb (122.925 kg)  SpO2: 94%       Physical Exam Constitutional: Patient is obese ,appears well-developed and well-nourished. No distress, appears somnolent HENT: Normocephalic, atraumatic, External right and left ear normal. Oropharynx is clear and moist.  Eyes: Conjunctivae and EOM are normal. PERRLA, no scleral icterus. Neck: Normal ROM. Neck supple. No JVD. No tracheal deviation. No thyromegaly. CVS: RRR, S1/S2 +, no murmurs, no gallops, no carotid bruit.  Pulmonary: Effort and breath sounds normal, no stridor, rhonchi, wheezes, rales.  Abdominal: Soft. BS +,  no distension, tenderness, rebound or guarding.  Musculoskeletal: Normal range of motion. 1+ bilateral pedal edema  Lymphadenopathy: No lymphadenopathy noted, cervical, inguinal or axillary Neuro: Alert. Normal reflexes, muscle tone coordination. No cranial nerve deficit. Skin: Skin is warm and dry. No rash noted. Not diaphoretic. No erythema. No pallor. Psychiatric: Normal mood and affect. Behavior, judgment, thought content normal.         Assessment & Plan:  Acute on chronic diastolic heart failure: EF 65-70% from 2-D echo of 09/2015 Dyspnea most likely secondary to pulmonary process rather than a cardiac as his BNP is stable and so is his weight. We will refer to cardiology as he continues to complain of intermittent chest pains which is absent at this time. for which I have placed him on an aspirin and nitroglycerin as needed. EKG today reveals NSR Educated on daily weight checks, limit fluids to 2 L per day, low-sodium heart healthy diet.  Hypoxia/ obesity hypoventilation syndrome/ obstructive sleep apnea: Likely due to obesity hypoventilation syndrome vs obstructive sleep apnea: Advised to continue using oxygen at rest. Referred for sleep study.   Type 2 diabetes mellitus: Newly diagnosed with A1c of 6.5. He will remain on dietary control as his blood sugars reveal he is doing well.  Morbid obesity: He is unable to exercise due to his current  exercise intolerance. I have advised him to work on cutting back on portion sizes.  Hypertension: Uncontrolled Increased dose of lisinopril from 2.5 mg to 10 mg. Obstructive sleep apnea could also contribute to the uncontrolled blood pressure.  This note has been created with Education officer, environmental. Any transcriptional errors are unintentional.

## 2015-10-31 NOTE — Patient Instructions (Signed)
Sleep Apnea  Sleep apnea is a sleep disorder characterized by abnormal pauses in breathing while you sleep. When your breathing pauses, the level of oxygen in your blood decreases. This causes you to move out of deep sleep and into light sleep. As a result, your quality of sleep is poor, and the system that carries your blood throughout your body (cardiovascular system) experiences stress. If sleep apnea remains untreated, the following conditions can develop:  High blood pressure (hypertension).  Coronary artery disease.  Inability to achieve or maintain an erection (impotence).  Impairment of your thought process (cognitive dysfunction). There are three types of sleep apnea: 1. Obstructive sleep apnea--Pauses in breathing during sleep because of a blocked airway. 2. Central sleep apnea--Pauses in breathing during sleep because the area of the brain that controls your breathing does not send the correct signals to the muscles that control breathing. 3. Mixed sleep apnea--A combination of both obstructive and central sleep apnea. RISK FACTORS The following risk factors can increase your risk of developing sleep apnea:  Being overweight.  Smoking.  Having narrow passages in your nose and throat.  Being of older age.  Being male.  Alcohol use.  Sedative and tranquilizer use.  Ethnicity. Among individuals younger than 35 years, African Americans are at increased risk of sleep apnea. SYMPTOMS   Difficulty staying asleep.  Daytime sleepiness and fatigue.  Loss of energy.  Irritability.  Loud, heavy snoring.  Morning headaches.  Trouble concentrating.  Forgetfulness.  Decreased interest in sex.  Unexplained sleepiness. DIAGNOSIS  In order to diagnose sleep apnea, your caregiver will perform a physical examination. A sleep study done in the comfort of your own home may be appropriate if you are otherwise healthy. Your caregiver may also recommend that you spend the  night in a sleep lab. In the sleep lab, several monitors record information about your heart, lungs, and brain while you sleep. Your leg and arm movements and blood oxygen level are also recorded. TREATMENT The following actions may help to resolve mild sleep apnea:  Sleeping on your side.   Using a decongestant if you have nasal congestion.   Avoiding the use of depressants, including alcohol, sedatives, and narcotics.   Losing weight and modifying your diet if you are overweight. There also are devices and treatments to help open your airway:  Oral appliances. These are custom-made mouthpieces that shift your lower jaw forward and slightly open your bite. This opens your airway.  Devices that create positive airway pressure. This positive pressure "splints" your airway open to help you breathe better during sleep. The following devices create positive airway pressure:  Continuous positive airway pressure (CPAP) device. The CPAP device creates a continuous level of air pressure with an air pump. The air is delivered to your airway through a mask while you sleep. This continuous pressure keeps your airway open.  Nasal expiratory positive airway pressure (EPAP) device. The EPAP device creates positive air pressure as you exhale. The device consists of single-use valves, which are inserted into each nostril and held in place by adhesive. The valves create very little resistance when you inhale but create much more resistance when you exhale. That increased resistance creates the positive airway pressure. This positive pressure while you exhale keeps your airway open, making it easier to breath when you inhale again.  Bilevel positive airway pressure (BPAP) device. The BPAP device is used mainly in patients with central sleep apnea. This device is similar to the CPAP device because   it also uses an air pump to deliver continuous air pressure through a mask. However, with the BPAP machine, the  pressure is set at two different levels. The pressure when you exhale is lower than the pressure when you inhale.  Surgery. Typically, surgery is only done if you cannot comply with less invasive treatments or if the less invasive treatments do not improve your condition. Surgery involves removing excess tissue in your airway to create a wider passage way.   This information is not intended to replace advice given to you by your health care provider. Make sure you discuss any questions you have with your health care provider.   Document Released: 10/15/2002 Document Revised: 11/15/2014 Document Reviewed: 03/02/2012 Elsevier Interactive Patient Education 2016 [employer]   

## 2015-10-31 NOTE — Progress Notes (Signed)
Patient's here for f/up hypoxemia. Patient having pain in chest since release from hospital. Patient can't describes pain, but is off and on.  Patients reports taking all meds including BP meds.

## 2015-11-19 ENCOUNTER — Encounter: Payer: Self-pay | Admitting: Cardiology

## 2015-11-24 NOTE — Progress Notes (Signed)
Referring: Dr Odette HornsEnobong  HPI: 55 year old male for evaluation of congestive heart failure. CTA October 2016 showed no pulmonary embolus. Echocardiogram November 2016 showed vigorous LV function, grade 1 diastolic dysfunction, mild left ventricular hypertrophy. Patient admitted in November 2016 with respiratory failure. He required intubation. Patient treated with diuresis as well. Seen back by primary care in December with complaints of continued dyspnea and chest pain. Cardiology asked to evaluate. Patient describes dyspnea on exertion. No orthopnea or PND. Occasional mild pedal edema. Occasional brief pain in the substernal area for 1-2 seconds. Not exertional. No associated symptoms.  Current Outpatient Prescriptions  Medication Sig Dispense Refill  . aspirin EC 81 MG tablet Take 1 tablet (81 mg total) by mouth daily. 30 tablet 2  . Blood Glucose Monitoring Suppl (TRUE METRIX METER) DEVI 1 each by Does not apply route daily. 1 Device 0  . furosemide (LASIX) 40 MG tablet Take 1 tablet (40 mg total) by mouth daily. 30 tablet 1  . glucose blood (TRUE METRIX BLOOD GLUCOSE TEST) test strip Use as instructed 100 each 12  . lisinopril (PRINIVIL,ZESTRIL) 10 MG tablet Take 1 tablet (10 mg total) by mouth daily. 30 tablet 2  . metoprolol tartrate (LOPRESSOR) 25 MG tablet Take 1 tablet (25 mg total) by mouth 2 (two) times daily. 180 tablet 3  . nitroGLYCERIN (NITROSTAT) 0.4 MG SL tablet Place 1 tablet (0.4 mg total) under the tongue every 5 (five) minutes as needed for chest pain. 30 tablet 1  . potassium chloride 20 MEQ TBCR Take 20 mEq by mouth daily as needed (if you take Furosemide (Lasix)). 30 tablet 1  . TRUEPLUS LANCETS 28G MISC 1 each by Does not apply route daily. 30 each 5   No current facility-administered medications for this visit.    No Known Allergies   Past Medical History  Diagnosis Date  . Hypertension   . Diabetes mellitus (HCC) 09/23/2015  . Hyperlipidemia     Past Surgical  History  Procedure Laterality Date  . No previous surgery      Social History   Social History  . Marital Status: Legally Separated    Spouse Name: N/A  . Number of Children: N/A  . Years of Education: N/A   Occupational History  . Not on file.   Social History Main Topics  . Smoking status: Never Smoker   . Smokeless tobacco: Not on file  . Alcohol Use: No  . Drug Use: No  . Sexual Activity: Not on file   Other Topics Concern  . Not on file   Social History Narrative    Family History  Problem Relation Age of Onset  . Diabetes Mother   . Hypertension Mother   . Kidney disease Mother     ROS: Occasional headache but no fevers or chills, productive cough, hemoptysis, dysphasia, odynophagia, melena, hematochezia, dysuria, hematuria, rash, seizure activity, orthopnea, PND, claudication. Remaining systems are negative.  Physical Exam:   Blood pressure 160/112, pulse 81, height 5\' 8"  (1.727 m), weight 274 lb 14.4 oz (124.694 kg).  General:  Well developed/obese in NAD Skin warm/dry Patient not depressed No peripheral clubbing Back-normal HEENT-normal/normal eyelids Neck supple/normal carotid upstroke bilaterally; no bruits; no JVD; no thyromegaly chest - CTA/ normal expansion CV - RRR/normal S1 and S2; no murmurs, rubs or gallops;  PMI nondisplaced Abdomen -NT/ND, no HSM, no mass, + bowel sounds, no bruit 2+ femoral pulses, no bruits Ext-no edema, chords, 2+ DP Neuro-grossly nonfocal  ECG 10/31/2015-sinus rhythm with  no ST changes.  Electrocardiogram today shows sinus rhythm at a rate of 81. Nonspecific ST changes.

## 2015-11-25 ENCOUNTER — Ambulatory Visit (INDEPENDENT_AMBULATORY_CARE_PROVIDER_SITE_OTHER): Payer: Self-pay | Admitting: Cardiology

## 2015-11-25 ENCOUNTER — Encounter: Payer: Self-pay | Admitting: Cardiology

## 2015-11-25 VITALS — BP 160/112 | HR 81 | Ht 68.0 in | Wt 274.9 lb

## 2015-11-25 DIAGNOSIS — E785 Hyperlipidemia, unspecified: Secondary | ICD-10-CM

## 2015-11-25 DIAGNOSIS — R079 Chest pain, unspecified: Secondary | ICD-10-CM | POA: Insufficient documentation

## 2015-11-25 DIAGNOSIS — R0600 Dyspnea, unspecified: Secondary | ICD-10-CM | POA: Insufficient documentation

## 2015-11-25 DIAGNOSIS — I5032 Chronic diastolic (congestive) heart failure: Secondary | ICD-10-CM

## 2015-11-25 HISTORY — DX: Chronic diastolic (congestive) heart failure: I50.32

## 2015-11-25 MED ORDER — ATORVASTATIN CALCIUM 40 MG PO TABS
40.0000 mg | ORAL_TABLET | Freq: Every day | ORAL | Status: AC
Start: 2015-11-25 — End: ?

## 2015-11-25 MED ORDER — LISINOPRIL 20 MG PO TABS: 20.0000 mg | ORAL_TABLET | Freq: Every day | ORAL | Status: DC

## 2015-11-25 MED FILL — ?ATORVASTATIN 40MG TABLET: 40 | 30 days supply | Qty: 30 | Fill #0

## 2015-11-25 MED FILL — ?LISINOPRIL 20 MG TABLET: 20 | 30 days supply | Qty: 30 | Fill #0

## 2015-11-25 NOTE — Assessment & Plan Note (Signed)
We discussed the importance of weight loss. 

## 2015-11-25 NOTE — Assessment & Plan Note (Signed)
Symptoms atypical. Schedule nuclear study for risk stratification. 

## 2015-11-25 NOTE — Assessment & Plan Note (Signed)
Patient appears to be euvolemic on examination. Continue present dose of Lasix.Check potassium and renal function. 

## 2015-11-25 NOTE — Assessment & Plan Note (Signed)
Blood pressure elevated. Increase lisinopril to 40 mg daily.Check potassium and renal function in 1 week. I have asked him to track his blood pressure at home. He will see one of assistance back next week for further adjustment of blood pressure medications. We discussed the importance of compliance.

## 2015-11-25 NOTE — Assessment & Plan Note (Signed)
Probably multifactorial. Likely contribution from diastolic congestive heart failure, hypertension, obesity hypoventilation syndrome, sleep apnea and deconditioning. Schedule nuclear study to screen for coronary disease. He is scheduled to see pulmonary next month for sleep evaluation.

## 2015-11-25 NOTE — Assessment & Plan Note (Signed)
Add Lipitor 40 mg daily. Check lipids and liver in 4 weeks. 

## 2015-11-25 NOTE — Patient Instructions (Signed)
Medication Instructions:   INCREASE LISINOPRIL TO 20 MG ONCE DAILY= 2 OF THE 10 MG TABLETS ONCE DAILY  START ATORVASTATIN 40 MG ONCE DAILY  Labwork:  Your physician recommends that you return for lab work in: ONE WEEK  Your physician recommends that you return for lab work in: 4 WEEKS - DO NOT EAT PRIOR TO LAB WORK  Testing/Procedures:  Your physician has requested that you have a lexiscan myoview. For further information please visit [email or web address] Please follow instruction sheet, as given.    Follow-Up:  Your physician recommends that you schedule a follow-up appointment in: ONE WEEK WITH EXTENDER FOR BLOOD PRESSURE CHECK  Your physician wants you to follow-up in: 3 MONTHS WITH DR Jens Som You will receive a reminder letter in the mail two months in advance. If you don't receive a letter, please call our office to schedule the follow-up appointment.    If you need a refill on your cardiac medications before your next appointment, please call your pharmacy.

## 2015-12-03 ENCOUNTER — Encounter: Payer: Self-pay | Admitting: Family Medicine

## 2015-12-03 ENCOUNTER — Telehealth (HOSPITAL_COMMUNITY): Payer: Self-pay

## 2015-12-03 ENCOUNTER — Ambulatory Visit: Payer: Medicaid Other | Attending: Family Medicine | Admitting: Family Medicine

## 2015-12-03 VITALS — BP 164/93 | HR 96 | Temp 98.5°F | Resp 15 | Ht 68.0 in | Wt 281.0 lb

## 2015-12-03 DIAGNOSIS — R0902 Hypoxemia: Secondary | ICD-10-CM | POA: Diagnosis not present

## 2015-12-03 DIAGNOSIS — Z79899 Other long term (current) drug therapy: Secondary | ICD-10-CM | POA: Diagnosis not present

## 2015-12-03 DIAGNOSIS — E662 Morbid (severe) obesity with alveolar hypoventilation: Secondary | ICD-10-CM | POA: Insufficient documentation

## 2015-12-03 DIAGNOSIS — E119 Type 2 diabetes mellitus without complications: Secondary | ICD-10-CM | POA: Diagnosis present

## 2015-12-03 DIAGNOSIS — Z7982 Long term (current) use of aspirin: Secondary | ICD-10-CM | POA: Insufficient documentation

## 2015-12-03 DIAGNOSIS — E1165 Type 2 diabetes mellitus with hyperglycemia: Secondary | ICD-10-CM | POA: Insufficient documentation

## 2015-12-03 DIAGNOSIS — E1140 Type 2 diabetes mellitus with diabetic neuropathy, unspecified: Secondary | ICD-10-CM | POA: Insufficient documentation

## 2015-12-03 DIAGNOSIS — G4733 Obstructive sleep apnea (adult) (pediatric): Secondary | ICD-10-CM | POA: Diagnosis not present

## 2015-12-03 DIAGNOSIS — Z6841 Body Mass Index (BMI) 40.0 and over, adult: Secondary | ICD-10-CM | POA: Diagnosis not present

## 2015-12-03 DIAGNOSIS — I10 Essential (primary) hypertension: Secondary | ICD-10-CM

## 2015-12-03 DIAGNOSIS — E1149 Type 2 diabetes mellitus with other diabetic neurological complication: Secondary | ICD-10-CM

## 2015-12-03 DIAGNOSIS — R079 Chest pain, unspecified: Secondary | ICD-10-CM | POA: Insufficient documentation

## 2015-12-03 DIAGNOSIS — I5032 Chronic diastolic (congestive) heart failure: Secondary | ICD-10-CM

## 2015-12-03 DIAGNOSIS — I5033 Acute on chronic diastolic (congestive) heart failure: Secondary | ICD-10-CM | POA: Diagnosis not present

## 2015-12-03 LAB — BASIC METABOLIC PANEL
BUN: 13 mg/dL (ref 7–25)
CHLORIDE: 100 mmol/L (ref 98–110)
CO2: 33 mmol/L — ABNORMAL HIGH (ref 20–31)
Calcium: 9.5 mg/dL (ref 8.6–10.3)
Creat: 0.96 mg/dL (ref 0.70–1.33)
Glucose, Bld: 191 mg/dL — ABNORMAL HIGH (ref 65–99)
POTASSIUM: 4.5 mmol/L (ref 3.5–5.3)
SODIUM: 139 mmol/L (ref 135–146)

## 2015-12-03 LAB — GLUCOSE, POCT (MANUAL RESULT ENTRY): POC GLUCOSE: 232 mg/dL — AB (ref 70–99)

## 2015-12-03 LAB — POCT GLYCOSYLATED HEMOGLOBIN (HGB A1C): Hemoglobin A1C: 8.1

## 2015-12-03 MED ORDER — GABAPENTIN 300 MG PO CAPS: 300.0000 mg | ORAL_CAPSULE | Freq: Two times a day (BID) | ORAL | Status: DC

## 2015-12-03 MED ORDER — METFORMIN HCL 500 MG PO TABS: 500.0000 mg | ORAL_TABLET | Freq: Two times a day (BID) | ORAL | Status: AC

## 2015-12-03 MED FILL — GABAPENTIN 300 MG CAPSULE: 300 | 30 days supply | Qty: 60 | Fill #0

## 2015-12-03 MED FILL — ?METFORMIN HCL 500MG TABLET: 500 | 30 days supply | Qty: 60 | Fill #0

## 2015-12-03 NOTE — Progress Notes (Signed)
Subjective:  Patient ID: Shane Hinton, male    DOB: 1961/06/14  Age: 55 y.o. MRN: [id number]  CC: Follow-up   HPI Shane Hinton is a  55 year old male with a history of acute on chronic diastolic heart failure (EF 65-70%), newly diagnosed type 2 diabetic (who was previously on diet control with A1c of 6.5  Which is now 8.1) recently hospitalized for respiratory failure secondary to obstructive sleep apnea versus obesity hypoventilation syndrome was commenced on oxygen during that admission.  He continues to remain hypoxic even on 2 L of oxygen and complains of dyspnea on mild exertion. He was seen by cardiology due to persisting chest pains; Lipitor was added to his regimen and the dose of lisinopril increased and he is scheduled to have a stress test in 2 days. He also has an upcoming appointment for a sleep study in 12/2015.  His blood pressure is elevated today and he informs me he is yet to take his antihypertensives but states he has been compliant with his medications He complains of numbness and occasional insulin needles in his feet and on the lateral aspect of his right leg.    Outpatient Prescriptions Prior to Visit  Medication Sig Dispense Refill  . aspirin EC 81 MG tablet Take 1 tablet (81 mg total) by mouth daily. 30 tablet 2  . atorvastatin (LIPITOR) 40 MG tablet Take 1 tablet (40 mg total) by mouth daily. 90 tablet 3  . Blood Glucose Monitoring Suppl (TRUE METRIX METER) DEVI 1 each by Does not apply route daily. 1 Device 0  . furosemide (LASIX) 40 MG tablet Take 1 tablet (40 mg total) by mouth daily. 30 tablet 1  . glucose blood (TRUE METRIX BLOOD GLUCOSE TEST) test strip Use as instructed 100 each 12  . lisinopril (PRINIVIL,ZESTRIL) 20 MG tablet Take 1 tablet (20 mg total) by mouth daily. 90 tablet 3  . metoprolol tartrate (LOPRESSOR) 25 MG tablet Take 1 tablet (25 mg total) by mouth 2 (two) times daily. 180 tablet 3  . nitroGLYCERIN (NITROSTAT) 0.4 MG SL tablet Place  1 tablet (0.4 mg total) under the tongue every 5 (five) minutes as needed for chest pain. 30 tablet 1  . potassium chloride 20 MEQ TBCR Take 20 mEq by mouth daily as needed (if you take Furosemide (Lasix)). 30 tablet 1  . TRUEPLUS LANCETS 28G MISC 1 each by Does not apply route daily. 30 each 5   No facility-administered medications prior to visit.    ROS Review of Systems Constitutional: Negative for fever, chills, diaphoresis, activity change, appetite change and positive for fatigue. HENT: Negative for ear pain, nosebleeds, congestion, facial swelling, rhinorrhea, neck pain, neck stiffness and ear discharge.  Eyes: Negative for pain, discharge, redness, itching and positive for visual disturbance. Respiratory: Negative for cough, choking, chest tightness, positive for shortness of breath, negative for wheezing and stridor.  Cardiovascular: positive for intermittent chest pain, negative for palpitations and leg swelling. Gastrointestinal: Negative for abdominal distention. Genitourinary: Negative for dysuria, urgency, frequency, hematuria, flank pain, decreased urine volume, difficulty urinating and dyspareunia.  Musculoskeletal: Negative for back pain, joint swelling, arthralgias and gait problem. Neurological: Negative for dizziness, tremors, seizures, syncope, facial asymmetry, speech difficulty, weakness, light-headedness, positive for numbness in both feet and lateral aspect of right leg Hematological: Negative for adenopathy. Does not bruise/bleed easily. Psychiatric/Behavioral: Negative for hallucin  Objective:  BP 164/93 mmHg  Pulse 96  Temp(Src) 98.5 F (36.9 C)  Resp 15  Ht  (1.727 m)  Wt 281 lb (127.461 kg)  BMI 42.74 kg/m2  SpO2 90%  BP/Weight 12/03/2015 11/25/2015 10/31/2015  Systolic BP 164 160 176  Diastolic BP 93 112 85  Wt. (Lbs) 281 274.9 271  BMI 42.74 41.81 41.22    Wt Readings from Last 3 Encounters:  12/03/15 281 lb (127.461 kg)  11/25/15 274 lb  14.4 oz (124.694 kg)  10/31/15 271 lb (122.925 kg)     Lab Results  Component Value Date   HGBA1C 8.10 12/03/2015     Physical Exam Constitutional: Patient is obese ,appears well-developed and well-nourished. No distress, appears somnolent HENT: Normocephalic, on 2 L of oxygen via nasal cannula  Neck: Normal ROM. Neck supple. No JVD. No tracheal deviation. No thyromegaly. CVS: RRR, S1/S2 +, no murmurs, no gallops, no carotid bruit.  Pulmonary: Effort and breath sounds normal, no stridor, rhonchi, wheezes, rales.  Abdominal: Soft. BS +,  no distension, tenderness, rebound or guarding.  Musculoskeletal: Normal range of motion. 1+ bilateral pedal edema  Lymphadenopathy: No lymphadenopathy noted, cervical, inguinal or axillary Neuro: Alert. Normal reflexes, muscle tone coordination. No cranial nerve deficit. Skin: Skin is warm and dry. No rash noted. Not diaphoretic. No erythema. No pallor. Psychiatric: Normal mood and affect. Behavior, judgment, thought content normal  Assessment & Plan:   1. Type 2 diabetes mellitus without complication, without long-term current use of insulin (HCC) Uncontrolled with A1c of 8.1 which has trended up from 6.5 months ago. We'll commence metformin and his blood sugar log will be reviewed at his next visit at which time he will also be seeing the clinical pharmacist for diabetic education - Glucose (CBG) - HgB A1c - metFORMIN (GLUCOPHAGE) 500 MG tablet; Take 1 tablet (500 mg total) by mouth 2 (two) times daily with a meal.  Dispense: 60 tablet; Refill: 3  2. Obstructive sleep apnea Explains daytime somnolence. Scheduled for sleep study next month  3. Hypoxemia Oxygen saturation on room air is 90%. He will need to see pulmonary for evaluation for underlying pulmonary process as his cardiac function does not fully explain his symptoms Continue 2 L oxygen via nasal cannula at rest  4. Essential (primary) hypertension Uncontrolled due to missing  morning dose of antihypertensives. Compliance emphasized. We'll repeat basic metabolic panel especially since the dose of his lisinopril was increased recently by cardiology. - Basic Metabolic Panel  5. Obesity hypoventilation syndrome (HCC) Advised on weight loss, reducing portion sizes and exercise as tolerated  6. Chronic diastolic (congestive) heart failure (HCC) EF of 65-70%, grade 1 diastolic dysfunction from 2-D echo 09/2015 He has gained 10 pounds in the last 1 month. Advised to limit fluid intake to less than 2 L a day, low-sodium diet Scheduled for myocardial perfusion scan in 2 days.  7. Other diabetic neurological complication associated with type 2 diabetes mellitus (HCC) - gabapentin (NEURONTIN) 300 MG capsule; Take 1 capsule (300 mg total) by mouth 2 (two) times daily.  Dispense: 60 capsule; Refill: 1   Meds ordered this encounter  Medications  . metFORMIN (GLUCOPHAGE) 500 MG tablet    Sig: Take 1 tablet (500 mg total) by mouth 2 (two) times daily with a meal.    Dispense:  60 tablet    Refill:  3  . gabapentin (NEURONTIN) 300 MG capsule    Sig: Take 1 capsule (300 mg total) by mouth 2 (two) times daily.    Dispense:  60 capsule    Refill:  1    Follow-up: Return in about 1 month (  around 01/03/2016) for  follow-up of diabetes mellitus and hypoxia.   Jaclyn Shaggy MD

## 2015-12-03 NOTE — Telephone Encounter (Signed)
Encounter complete. 

## 2015-12-03 NOTE — Progress Notes (Signed)
Patient reports he has a stress test on Friday He refuses Tdp today Has had flu

## 2015-12-03 NOTE — Patient Instructions (Signed)
Hypertension Hypertension, commonly called high blood pressure, is when the force of blood pumping through your arteries is too strong. Your arteries are the blood vessels that carry blood from your heart throughout your body. A blood pressure reading consists of a higher number over a lower number, such as 110/72. The higher number (systolic) is the pressure inside your arteries when your heart pumps. The lower number (diastolic) is the pressure inside your arteries when your heart relaxes. Ideally you want your blood pressure below 120/80. Hypertension forces your heart to work harder to pump blood. Your arteries may become narrow or stiff. Having untreated or uncontrolled hypertension can cause heart attack, stroke, kidney disease, and other problems. RISK FACTORS Some risk factors for high blood pressure are controllable. Others are not.  Risk factors you cannot control include:   Race. You may be at higher risk if you are African American.  Age. Risk increases with age.  Gender. Men are at higher risk than women before age 45 years. After age 65, women are at higher risk than men. Risk factors you can control include:  Not getting enough exercise or physical activity.  Being overweight.  Getting too much fat, sugar, calories, or salt in your diet.  Drinking too much alcohol. SIGNS AND SYMPTOMS Hypertension does not usually cause signs or symptoms. Extremely high blood pressure (hypertensive crisis) may cause headache, anxiety, shortness of breath, and nosebleed. DIAGNOSIS To check if you have hypertension, your health care provider will measure your blood pressure while you are seated, with your arm held at the level of your heart. It should be measured at least twice using the same arm. Certain conditions can cause a difference in blood pressure between your right and left arms. A blood pressure reading that is higher than normal on one occasion does not mean that you need treatment. If  it is not clear whether you have high blood pressure, you may be asked to return on a different day to have your blood pressure checked again. Or, you may be asked to monitor your blood pressure at home for 1 or more weeks. TREATMENT Treating high blood pressure includes making lifestyle changes and possibly taking medicine. Living a healthy lifestyle can help lower high blood pressure. You may need to change some of your habits. Lifestyle changes may include:  Following the DASH diet. This diet is high in fruits, vegetables, and whole grains. It is low in salt, red meat, and added sugars.  Keep your sodium intake below 2,300 mg per day.  Getting at least 30-45 minutes of aerobic exercise at least 4 times per week.  Losing weight if necessary.  Not smoking.  Limiting alcoholic beverages.  Learning ways to reduce stress. Your health care provider may prescribe medicine if lifestyle changes are not enough to get your blood pressure under control, and if one of the following is true:  You are 18-59 years of age and your systolic blood pressure is above 140.  You are 60 years of age or older, and your systolic blood pressure is above 150.  Your diastolic blood pressure is above 90.  You have diabetes, and your systolic blood pressure is over 140 or your diastolic blood pressure is over 90.  You have kidney disease and your blood pressure is above 140/90.  You have heart disease and your blood pressure is above 140/90. Your personal target blood pressure may vary depending on your medical conditions, your age, and other factors. HOME CARE INSTRUCTIONS    Have your blood pressure rechecked as directed by your health care provider.   Take medicines only as directed by your health care provider. Follow the directions carefully. Blood pressure medicines must be taken as prescribed. The medicine does not work as well when you skip doses. Skipping doses also puts you at risk for  problems.  Do not smoke.   Monitor your blood pressure at home as directed by your health care provider. SEEK MEDICAL CARE IF:   You think you are having a reaction to medicines taken.  You have recurrent headaches or feel dizzy.  You have swelling in your ankles.  You have trouble with your vision. SEEK IMMEDIATE MEDICAL CARE IF:  You develop a severe headache or confusion.  You have unusual weakness, numbness, or feel faint.  You have severe chest or abdominal pain.  You vomit repeatedly.  You have trouble breathing. MAKE SURE YOU:   Understand these instructions.  Will watch your condition.  Will get help right away if you are not doing well or get worse.   This information is not intended to replace advice given to you by your health care provider. Make sure you discuss any questions you have with your health care provider.   Document Released: 10/25/2005 Document Revised: 03/11/2015 Document Reviewed: 08/17/2013 Elsevier Interactive Patient Education 2016 [employer]  

## 2015-12-04 ENCOUNTER — Telehealth: Payer: Self-pay | Admitting: *Deleted

## 2015-12-04 NOTE — Telephone Encounter (Signed)
-----   Message from Jaclyn Shaggy, MD sent at 12/04/2015 12:59 PM EST ----- Labs are stable

## 2015-12-04 NOTE — Telephone Encounter (Signed)
Verified name and date of birth and gave lab results.  RN asked patient if he had made his follow up appointment to see MD in 1 month.  He said he was told at checkout that he would have to call back in 1 month to make appointment.  RN placed patient on hold and called front desk.  Spoke to [place] who stated she would make his appointment.  Transferred patient to her.

## 2015-12-05 ENCOUNTER — Encounter: Payer: Self-pay | Admitting: Physician Assistant

## 2015-12-05 ENCOUNTER — Ambulatory Visit (HOSPITAL_COMMUNITY)
Admission: RE | Admit: 2015-12-05 | Discharge: 2015-12-05 | Disposition: A | Discharge status: Home / Self Care | Payer: Medicaid Other | Source: Ambulatory Visit | Attending: Cardiovascular Disease | Admitting: Cardiovascular Disease

## 2015-12-05 ENCOUNTER — Ambulatory Visit (INDEPENDENT_AMBULATORY_CARE_PROVIDER_SITE_OTHER): Payer: Self-pay | Admitting: Physician Assistant

## 2015-12-05 DIAGNOSIS — R5383 Other fatigue: Secondary | ICD-10-CM | POA: Insufficient documentation

## 2015-12-05 DIAGNOSIS — E119 Type 2 diabetes mellitus without complications: Secondary | ICD-10-CM | POA: Diagnosis not present

## 2015-12-05 DIAGNOSIS — R002 Palpitations: Secondary | ICD-10-CM | POA: Diagnosis not present

## 2015-12-05 DIAGNOSIS — Z6841 Body Mass Index (BMI) 40.0 and over, adult: Secondary | ICD-10-CM | POA: Diagnosis not present

## 2015-12-05 DIAGNOSIS — I5032 Chronic diastolic (congestive) heart failure: Secondary | ICD-10-CM

## 2015-12-05 DIAGNOSIS — R079 Chest pain, unspecified: Secondary | ICD-10-CM | POA: Insufficient documentation

## 2015-12-05 DIAGNOSIS — E669 Obesity, unspecified: Secondary | ICD-10-CM | POA: Insufficient documentation

## 2015-12-05 DIAGNOSIS — I517 Cardiomegaly: Secondary | ICD-10-CM | POA: Insufficient documentation

## 2015-12-05 DIAGNOSIS — R0602 Shortness of breath: Secondary | ICD-10-CM | POA: Diagnosis not present

## 2015-12-05 DIAGNOSIS — I10 Essential (primary) hypertension: Secondary | ICD-10-CM | POA: Insufficient documentation

## 2015-12-05 DIAGNOSIS — E785 Hyperlipidemia, unspecified: Secondary | ICD-10-CM

## 2015-12-05 DIAGNOSIS — R0609 Other forms of dyspnea: Secondary | ICD-10-CM | POA: Diagnosis not present

## 2015-12-05 DIAGNOSIS — R0600 Dyspnea, unspecified: Secondary | ICD-10-CM | POA: Diagnosis not present

## 2015-12-05 DIAGNOSIS — R0789 Other chest pain: Secondary | ICD-10-CM

## 2015-12-05 DIAGNOSIS — E662 Morbid (severe) obesity with alveolar hypoventilation: Secondary | ICD-10-CM

## 2015-12-05 LAB — MYOCARDIAL PERFUSION IMAGING
CHL CUP NUCLEAR SDS: 0
CHL CUP NUCLEAR SSS: 0
CSEPPHR: 99 {beats}/min
LVDIAVOL: 158 mL
LVSYSVOL: 87 mL
Rest HR: 76 {beats}/min
SRS: 0
TID: 1.16

## 2015-12-05 MED ORDER — TECHNETIUM TC 99M SESTAMIBI GENERIC - CARDIOLITE
30.5000 | Freq: Once | INTRAVENOUS | Status: AC | PRN
  Administered 2015-12-05: 30.5 via INTRAVENOUS

## 2015-12-05 MED ORDER — REGADENOSON 0.4 MG/5ML IV SOLN
0.4000 mg | Freq: Once | INTRAVENOUS | Status: AC
  Administered 2015-12-05: 0.4 mg via INTRAVENOUS

## 2015-12-05 MED ORDER — TECHNETIUM TC 99M SESTAMIBI GENERIC - CARDIOLITE
10.3000 | Freq: Once | INTRAVENOUS | Status: AC | PRN
  Administered 2015-12-05: 10.3 via INTRAVENOUS

## 2015-12-05 MED ORDER — METOPROLOL TARTRATE 50 MG PO TABS: 50.0000 mg | ORAL_TABLET | Freq: Two times a day (BID) | ORAL | Status: AC

## 2015-12-05 NOTE — Patient Instructions (Addendum)
Your physician has recommended you make the following change in your medication: the lopressor has been increased from 25 mg twice a day to 50 mg twice a day. A new prescription has been sent to your pharmacy to reflect this change. ( you can take #2 of the 25 mg tablets twice a day until gone then start the new 50 mg prescription and take as directed on the bottle.  Keep all follow up appointments as planned.  Weigh daily Call [phone] if weight climbs more than 3 pounds in a day or 5 pounds in a week. No salt to very little salt in your diet.  No more than 2000 mg in a day. Call if increased shortness of breath or increased swelling.   Your physician recommends that you schedule a follow-up appointment in: 2-3 months with Dr Jens Som.

## 2015-12-05 NOTE — Progress Notes (Signed)
Patient ID: Shane Hinton, male   DOB: [birth year], 55 y.o.   MRN: [id number]    Date:  12/05/2015   ID:  Shane Hinton, DOB 1961/04/25, MRN [id number]  PCP:  No PCP Per Patient  Primary Cardiologist:  Jens Som  Chief Complaint  Patient presents with  . Follow-up    bp check per Dr. Crenshaw//pt c/o a thump in his chest every once in a while, SOB on exertion, lightheadedness when he changes positions too quickly, and swelling in bilateral legs/ankles/feet     History of Present Illness: Shane Hinton is a 55 y.o. male   55 year old morbidly obese male whjo was seen by Dr. Jens Som a week ago for evaluation of congestive heart failure. CTA October 2016 showed no pulmonary embolus. Echocardiogram November 2016 showed vigorous LV function, grade 1 diastolic dysfunction, mild left ventricular hypertrophy. Patient admitted in November 2016 with respiratory failure. He required intubation. Patient treated with diuresis as well. Seen back by primary care in December with complaints of continued dyspnea and chest pain. Cardiology asked to evaluate.     Shane Hinton presents for follow-up evaluation of blood pressure. During his last office visit  Lisinopril was increased to 40 mg.    Reports having some chest pain here and there. Does get dyspnea with exertion and is carrying an oxygen take around just in case he needs it.   He also wakes up occasionally feeling short of breath sleeps on one pillow.  He also has some mild lower extremity edema but otherwise denies nausea, vomiting, fever, shortness of breath at rest currently, orthopnea, dizziness,  cough, congestion, abdominal pain, hematochezia, melena, lower extremity edema, claudication.   He wakes up very frequently to the bathroom. Be scheduled for sleep study February 22 20  Wt Readings from Last 3 Encounters:  12/05/15 280 lb (127.007 kg)  12/03/15 281 lb (127.461 kg)  11/25/15 274 lb 14.4 oz (124.694 kg)     Past Medical History    Diagnosis Date  . Hypertension   . Diabetes mellitus (HCC) 09/23/2015  . Hyperlipidemia   . Chronic diastolic (congestive) heart failure (HCC) 11/25/2015    Current Outpatient Prescriptions  Medication Sig Dispense Refill  . aspirin EC 81 MG tablet Take 1 tablet (81 mg total) by mouth daily. 30 tablet 2  . atorvastatin (LIPITOR) 40 MG tablet Take 1 tablet (40 mg total) by mouth daily. 90 tablet 3  . Blood Glucose Monitoring Suppl (TRUE METRIX METER) DEVI 1 each by Does not apply route daily. 1 Device 0  . furosemide (LASIX) 40 MG tablet Take 1 tablet (40 mg total) by mouth daily. 30 tablet 1  . gabapentin (NEURONTIN) 300 MG capsule Take 1 capsule (300 mg total) by mouth 2 (two) times daily. 60 capsule 1  . glucose blood (TRUE METRIX BLOOD GLUCOSE TEST) test strip Use as instructed 100 each 12  . lisinopril (PRINIVIL,ZESTRIL) 40 MG tablet Take 40 mg by mouth daily.    . metFORMIN (GLUCOPHAGE) 500 MG tablet Take 1 tablet (500 mg total) by mouth 2 (two) times daily with a meal. 60 tablet 3  . nitroGLYCERIN (NITROSTAT) 0.4 MG SL tablet Place 1 tablet (0.4 mg total) under the tongue every 5 (five) minutes as needed for chest pain. 30 tablet 1  . potassium chloride 20 MEQ TBCR Take 20 mEq by mouth daily as needed (if you take Furosemide (Lasix)). 30 tablet 1  . TRUEPLUS LANCETS 28G MISC 1 each by Does not apply route daily. 30 each  5  . metoprolol (LOPRESSOR) 50 MG tablet Take 1 tablet (50 mg total) by mouth 2 (two) times daily. 60 tablet 11   No current facility-administered medications for this visit.    Allergies:   No Known Allergies  Social History:  The patient  reports that he has never smoked. He does not have any smokeless tobacco history on file. He reports that he does not drink alcohol or use illicit drugs.   Family history:   Family History  Problem Relation Age of Onset  . Diabetes Mother   . Hypertension Mother   . Kidney disease Mother     ROS:  Please see the history  of present illness.  All other systems reviewed and negative.   PHYSICAL EXAM: VS:  BP 156/102 mmHg  Pulse 86  Ht  (1.727 m)  Wt 280 lb (127.007 kg)  BMI 42.58 kg/m2 Morbidly obese, well developed, in no acute distress HEENT: Pupils are equal round react to light accommodation extraocular movements are intact.  Neck: no JVDNo cervical lymphadenopathy. Cardiac: Regular rate and rhythm without murmurs rubs or gallops. Lungs:  clear to auscultation bilaterally, no wheezing, rhonchi or rales Abd: soft, nontender, positive bowel sounds all quadrants, no hepatosplenomegaly Ext: 1+ lower extremity edema.  2+ radial and dorsalis pedis pulses. Skin: warm and dry Neuro:  Grossly normal      ASSESSMENT AND PLAN:  Problem List Items Addressed This Visit    Obesity hypoventilation syndrome (HCC)   Morbid obesity (HCC) - Primary   HLD (hyperlipidemia)   Relevant Medications   metoprolol (LOPRESSOR) 50 MG tablet   lisinopril (PRINIVIL,ZESTRIL) 40 MG tablet   Essential (primary) hypertension   Relevant Medications   metoprolol (LOPRESSOR) 50 MG tablet   lisinopril (PRINIVIL,ZESTRIL) 40 MG tablet   Chronic diastolic (congestive) heart failure (HCC)   Relevant Medications   metoprolol (LOPRESSOR) 50 MG tablet   lisinopril (PRINIVIL,ZESTRIL) 40 MG tablet   Chest pain      Shane Hinton seems to be doing well. Except some mild lower extremity edema does not.. Be overtly volume overloaded. Continue Lasix 40 mg daily. Is also on lisinopril 40 mg.   Blood pressure is still elevated to increase his metoprolol to 50 mg twice a day. He is scheduled for stress test this afternoon. Sleep study scheduled for February 22. Follow-up in 2-3 months with Dr. Jens Som. Continue Lipitor for cholesterol.

## 2015-12-31 ENCOUNTER — Ambulatory Visit (HOSPITAL_BASED_OUTPATIENT_CLINIC_OR_DEPARTMENT_OTHER): Payer: Medicaid Other | Attending: Family Medicine | Admitting: Radiology

## 2015-12-31 DIAGNOSIS — Z6841 Body Mass Index (BMI) 40.0 and over, adult: Secondary | ICD-10-CM | POA: Diagnosis not present

## 2015-12-31 DIAGNOSIS — G4719 Other hypersomnia: Secondary | ICD-10-CM | POA: Diagnosis not present

## 2015-12-31 DIAGNOSIS — I10 Essential (primary) hypertension: Secondary | ICD-10-CM | POA: Diagnosis not present

## 2015-12-31 DIAGNOSIS — E669 Obesity, unspecified: Secondary | ICD-10-CM | POA: Diagnosis not present

## 2015-12-31 DIAGNOSIS — G4736 Sleep related hypoventilation in conditions classified elsewhere: Secondary | ICD-10-CM | POA: Diagnosis not present

## 2015-12-31 DIAGNOSIS — R0902 Hypoxemia: Secondary | ICD-10-CM

## 2015-12-31 DIAGNOSIS — Z7984 Long term (current) use of oral hypoglycemic drugs: Secondary | ICD-10-CM | POA: Insufficient documentation

## 2015-12-31 DIAGNOSIS — G4733 Obstructive sleep apnea (adult) (pediatric): Secondary | ICD-10-CM | POA: Diagnosis present

## 2015-12-31 DIAGNOSIS — R0683 Snoring: Secondary | ICD-10-CM | POA: Insufficient documentation

## 2015-12-31 DIAGNOSIS — Z79899 Other long term (current) drug therapy: Secondary | ICD-10-CM | POA: Diagnosis not present

## 2015-12-31 DIAGNOSIS — E662 Morbid (severe) obesity with alveolar hypoventilation: Secondary | ICD-10-CM

## 2015-12-31 DIAGNOSIS — E119 Type 2 diabetes mellitus without complications: Secondary | ICD-10-CM | POA: Insufficient documentation

## 2015-12-31 DIAGNOSIS — R5383 Other fatigue: Secondary | ICD-10-CM | POA: Diagnosis not present

## 2016-01-02 ENCOUNTER — Ambulatory Visit: Payer: Self-pay | Admitting: Family Medicine

## 2016-01-04 DIAGNOSIS — E662 Morbid (severe) obesity with alveolar hypoventilation: Secondary | ICD-10-CM

## 2016-01-04 DIAGNOSIS — R0902 Hypoxemia: Secondary | ICD-10-CM

## 2016-01-04 DIAGNOSIS — G4733 Obstructive sleep apnea (adult) (pediatric): Secondary | ICD-10-CM

## 2016-01-04 NOTE — Progress Notes (Signed)
  Patient Name: Shane Hinton, Shane Hinton Date: 12/31/2015 Gender: Male D.O.B: 10-20-1961 Age (years): 54 Referring Provider: Jaclyn Shaggy Height (inches): 68 Interpreting Physician: Jetty Duhamel MD, ABSM Weight (lbs): 280 RPSGT: Ulyess Mort BMI: 43 MRN: [id number] Neck Size: 16.50 CLINICAL INFORMATION Sleep Study Type: NPSG   Indication for sleep study: Diabetes, Excessive Daytime Sleepiness, Fatigue, Hypertension, Obesity, OSA, Snoring   Epworth Sleepiness Score: 21/24   SLEEP STUDY TECHNIQUE As per the AASM Manual for the Scoring of Sleep and Associated Events v2.3 (April 2016) with a hypopnea requiring 4% desaturations. The channels recorded and monitored were frontal, central and occipital EEG, electrooculogram (EOG), submentalis EMG (chin), nasal and oral airflow, thoracic and abdominal wall motion, anterior tibialis EMG, snore microphone, electrocardiogram, and pulse oximetry.  MEDICATIONS Patient's medications include: charted for review Medications self-administered by patient during sleep study :METFORMIN, GABAPENTIN, METOPROLOL.  SLEEP ARCHITECTURE The study was initiated at 10:15:16 PM and ended at 4:52:21 AM. Sleep onset time was 0.9 minutes and the sleep efficiency was 92.5%. The total sleep time was 367.1 minutes. Stage REM latency was 175.5 minutes. The patient spent 12.98% of the night in stage N1 sleep, 68.09% in stage N2 sleep, 0.00% in stage N3 and 18.93% in REM. Alpha intrusion was absent. Supine sleep was 48.35%.  RESPIRATORY PARAMETERS The overall apnea/hypopnea index (AHI) was 14.1 per hour. There were 14 total apneas, including 14 obstructive, 0 central and 0 mixed apneas. There were 72 hypopneas and 42 RERAs. The AHI during Stage REM sleep was 33.7 per hour. AHI while supine was 6.8 per hour. The mean oxygen saturation was 95.72%. The minimum SpO2 during sleep was 59.00%. Sudy done on patient's usual O2 2L/ min. Moderate snoring was noted during  this study. Wake after sleep onset 29 minutes  CARDIAC DATA The 2 lead EKG demonstrated sinus rhythm. The mean heart rate was 66.37 beats per minute. Other EKG findings include: None.  LEG MOVEMENT DATA The total PLMS were 0 with a resulting PLMS index of 0.00. Associated arousal with leg movement index was 0.0 .  IMPRESSIONS - Mild obstructive sleep apnea occurred during this study (AHI = 14.1/h). - There were not enough early events to meet protocol requirements for split CPAP titration. - No significant central sleep apnea occurred during this study (CAI = 0.0/h). - Severe oxygen desaturation was noted during this study (Min O2 = 59.00%). Patient wore supplemental O2 2L/min. - The patient snored with Moderate snoring volume. - No cardiac abnormalities were noted during this study. - Clinically significant periodic limb movements did not occur during sleep. No significant associated arousals.  DIAGNOSIS - Obstructive Sleep Apnea (327.23 [G47.33 ICD-10]) - Nocturnal Hypoxemia (327.26 [G47.36 ICD-10]  RECOMMENDATIONS - Therapeutic CPAP titration to determine optimal pressure required to alleviate sleep disordered breathing. - Avoid alcohol, sedatives and other CNS depressants that may worsen sleep apnea and disrupt normal sleep architecture. - Sleep hygiene should be reviewed to assess factors that may improve sleep quality. - Weight management and regular exercise should be initiated or continued if appropriate.  Waymon Budge Diplomate, American Board of Sleep Medicine  ELECTRONICALLY SIGNED ON:  01/04/2016, 9:48 AM [facility] PH: [phone]   FX: [phone] ACCREDITED BY THE AMERICAN ACADEMY OF SLEEP MEDICINE

## 2016-02-16 ENCOUNTER — Inpatient Hospital Stay (HOSPITAL_COMMUNITY): Payer: Medicaid Other

## 2016-02-16 ENCOUNTER — Emergency Department (HOSPITAL_BASED_OUTPATIENT_CLINIC_OR_DEPARTMENT_OTHER): Payer: Medicaid Other

## 2016-02-16 ENCOUNTER — Encounter (HOSPITAL_BASED_OUTPATIENT_CLINIC_OR_DEPARTMENT_OTHER): Payer: Self-pay | Admitting: *Deleted

## 2016-02-16 ENCOUNTER — Inpatient Hospital Stay (HOSPITAL_BASED_OUTPATIENT_CLINIC_OR_DEPARTMENT_OTHER)
Admission: EM | Admit: 2016-02-16 | Discharge: 2016-02-21 | DRG: 291 | Disposition: A | Discharge status: Home / Self Care | Payer: Medicaid Other | Attending: Internal Medicine | Admitting: Internal Medicine

## 2016-02-16 DIAGNOSIS — G8929 Other chronic pain: Secondary | ICD-10-CM | POA: Diagnosis present

## 2016-02-16 DIAGNOSIS — E785 Hyperlipidemia, unspecified: Secondary | ICD-10-CM | POA: Diagnosis present

## 2016-02-16 DIAGNOSIS — M549 Dorsalgia, unspecified: Secondary | ICD-10-CM

## 2016-02-16 DIAGNOSIS — I10 Essential (primary) hypertension: Secondary | ICD-10-CM

## 2016-02-16 DIAGNOSIS — E1149 Type 2 diabetes mellitus with other diabetic neurological complication: Secondary | ICD-10-CM

## 2016-02-16 DIAGNOSIS — E872 Acidosis: Secondary | ICD-10-CM | POA: Diagnosis present

## 2016-02-16 DIAGNOSIS — R140 Abdominal distension (gaseous): Secondary | ICD-10-CM

## 2016-02-16 DIAGNOSIS — I509 Heart failure, unspecified: Secondary | ICD-10-CM | POA: Diagnosis not present

## 2016-02-16 DIAGNOSIS — Z7984 Long term (current) use of oral hypoglycemic drugs: Secondary | ICD-10-CM

## 2016-02-16 DIAGNOSIS — E1169 Type 2 diabetes mellitus with other specified complication: Secondary | ICD-10-CM | POA: Diagnosis not present

## 2016-02-16 DIAGNOSIS — G4733 Obstructive sleep apnea (adult) (pediatric): Secondary | ICD-10-CM | POA: Diagnosis not present

## 2016-02-16 DIAGNOSIS — J9602 Acute respiratory failure with hypercapnia: Secondary | ICD-10-CM

## 2016-02-16 DIAGNOSIS — R0689 Other abnormalities of breathing: Secondary | ICD-10-CM | POA: Diagnosis present

## 2016-02-16 DIAGNOSIS — Z930 Tracheostomy status: Secondary | ICD-10-CM | POA: Insufficient documentation

## 2016-02-16 DIAGNOSIS — E1142 Type 2 diabetes mellitus with diabetic polyneuropathy: Secondary | ICD-10-CM | POA: Diagnosis present

## 2016-02-16 DIAGNOSIS — R0902 Hypoxemia: Secondary | ICD-10-CM

## 2016-02-16 DIAGNOSIS — Z8249 Family history of ischemic heart disease and other diseases of the circulatory system: Secondary | ICD-10-CM

## 2016-02-16 DIAGNOSIS — E662 Morbid (severe) obesity with alveolar hypoventilation: Secondary | ICD-10-CM | POA: Diagnosis present

## 2016-02-16 DIAGNOSIS — Z9981 Dependence on supplemental oxygen: Secondary | ICD-10-CM

## 2016-02-16 DIAGNOSIS — I110 Hypertensive heart disease with heart failure: Secondary | ICD-10-CM | POA: Diagnosis present

## 2016-02-16 DIAGNOSIS — Z6841 Body Mass Index (BMI) 40.0 and over, adult: Secondary | ICD-10-CM

## 2016-02-16 DIAGNOSIS — I5033 Acute on chronic diastolic (congestive) heart failure: Secondary | ICD-10-CM | POA: Diagnosis present

## 2016-02-16 DIAGNOSIS — I5032 Chronic diastolic (congestive) heart failure: Secondary | ICD-10-CM | POA: Diagnosis not present

## 2016-02-16 DIAGNOSIS — R0600 Dyspnea, unspecified: Secondary | ICD-10-CM

## 2016-02-16 DIAGNOSIS — J9601 Acute respiratory failure with hypoxia: Secondary | ICD-10-CM | POA: Diagnosis not present

## 2016-02-16 DIAGNOSIS — Z79899 Other long term (current) drug therapy: Secondary | ICD-10-CM

## 2016-02-16 DIAGNOSIS — R609 Edema, unspecified: Secondary | ICD-10-CM

## 2016-02-16 DIAGNOSIS — Z7982 Long term (current) use of aspirin: Secondary | ICD-10-CM

## 2016-02-16 DIAGNOSIS — I5031 Acute diastolic (congestive) heart failure: Secondary | ICD-10-CM

## 2016-02-16 DIAGNOSIS — M419 Scoliosis, unspecified: Secondary | ICD-10-CM | POA: Diagnosis present

## 2016-02-16 DIAGNOSIS — E119 Type 2 diabetes mellitus without complications: Secondary | ICD-10-CM

## 2016-02-16 DIAGNOSIS — J810 Acute pulmonary edema: Secondary | ICD-10-CM

## 2016-02-16 DIAGNOSIS — J9622 Acute and chronic respiratory failure with hypercapnia: Secondary | ICD-10-CM | POA: Diagnosis present

## 2016-02-16 DIAGNOSIS — Z9989 Dependence on other enabling machines and devices: Secondary | ICD-10-CM

## 2016-02-16 DIAGNOSIS — J9600 Acute respiratory failure, unspecified whether with hypoxia or hypercapnia: Secondary | ICD-10-CM

## 2016-02-16 DIAGNOSIS — J9621 Acute and chronic respiratory failure with hypoxia: Secondary | ICD-10-CM | POA: Diagnosis present

## 2016-02-16 DIAGNOSIS — Z833 Family history of diabetes mellitus: Secondary | ICD-10-CM | POA: Diagnosis not present

## 2016-02-16 DIAGNOSIS — R0602 Shortness of breath: Secondary | ICD-10-CM | POA: Diagnosis present

## 2016-02-16 DIAGNOSIS — G4730 Sleep apnea, unspecified: Secondary | ICD-10-CM

## 2016-02-16 DIAGNOSIS — I119 Hypertensive heart disease without heart failure: Secondary | ICD-10-CM | POA: Diagnosis present

## 2016-02-16 DIAGNOSIS — R05 Cough: Secondary | ICD-10-CM

## 2016-02-16 DIAGNOSIS — R059 Cough, unspecified: Secondary | ICD-10-CM

## 2016-02-16 DIAGNOSIS — N179 Acute kidney failure, unspecified: Secondary | ICD-10-CM

## 2016-02-16 DIAGNOSIS — G9340 Encephalopathy, unspecified: Secondary | ICD-10-CM

## 2016-02-16 HISTORY — DX: Obstructive sleep apnea (adult) (pediatric): G47.33

## 2016-02-16 HISTORY — DX: Chronic respiratory failure, unspecified whether with hypoxia or hypercapnia: J96.10

## 2016-02-16 HISTORY — DX: Influenza due to unidentified influenza virus with unspecified type of pneumonia: J11.00

## 2016-02-16 HISTORY — DX: Scoliosis, unspecified: M41.9

## 2016-02-16 LAB — BASIC METABOLIC PANEL
Anion gap: <3 — ABNORMAL LOW (ref 5–15)
BUN: 27 mg/dL — AB (ref 6–20)
CHLORIDE: 97 mmol/L — AB (ref 101–111)
CO2: 39 mmol/L — AB (ref 22–32)
CREATININE: 1.14 mg/dL (ref 0.61–1.24)
Calcium: 8.6 mg/dL — ABNORMAL LOW (ref 8.9–10.3)
GFR calc Af Amer: >60 mL/min (ref >60)
GFR calc non Af Amer: >60 mL/min (ref >60)
GLUCOSE: 218 mg/dL — AB (ref 65–99)
Potassium: 3.5 mmol/L (ref 3.5–5.1)
Sodium: 138 mmol/L (ref 135–145)

## 2016-02-16 LAB — CBC WITH DIFFERENTIAL/PLATELET
Basophils Absolute: 0 10*3/uL (ref 0.0–0.1)
Basophils Relative: 0 %
Eosinophils Absolute: 0.1 10*3/uL (ref 0.0–0.7)
Eosinophils Relative: 1 %
HEMATOCRIT: 48.1 % (ref 39.0–52.0)
HEMOGLOBIN: 15.3 g/dL (ref 13.0–17.0)
LYMPHS ABS: 1.7 10*3/uL (ref 0.7–4.0)
Lymphocytes Relative: 26 %
MCH: 30.1 pg (ref 26.0–34.0)
MCHC: 31.8 g/dL (ref 30.0–36.0)
MCV: 94.5 fL (ref 78.0–100.0)
MONO ABS: 0.7 10*3/uL (ref 0.1–1.0)
MONOS PCT: 12 %
NEUTROS ABS: 4 10*3/uL (ref 1.7–7.7)
NEUTROS PCT: 62 %
Platelets: 243 10*3/uL (ref 150–400)
RBC: 5.09 MIL/uL (ref 4.22–5.81)
RDW: 15 % (ref 11.5–15.5)
WBC: 6.4 10*3/uL (ref 4.0–10.5)

## 2016-02-16 LAB — I-STAT ARTERIAL BLOOD GAS, ED
ACID-BASE EXCESS: 10 mmol/L — AB (ref 0.0–2.0)
ACID-BASE EXCESS: 11 mmol/L — AB (ref 0.0–2.0)
Acid-Base Excess: 14 mmol/L — ABNORMAL HIGH (ref 0.0–2.0)
BICARBONATE: 46.8 meq/L — AB (ref 20.0–24.0)
BICARBONATE: 43 meq/L — AB (ref 20.0–24.0)
Bicarbonate: 43.6 mEq/L — ABNORMAL HIGH (ref 20.0–24.0)
O2 SAT: 92 %
O2 SAT: 91 %
O2 Saturation: 89 %
PCO2 ART: 96.8 mmHg — AB (ref 35.0–45.0)
PCO2 ART: 94 mmHg — AB (ref 35.0–45.0)
PH ART: 7.281 — AB (ref 7.350–7.450)
PO2 ART: 74 mmHg — AB (ref 80.0–100.0)
PO2 ART: 67 mmHg — AB (ref 80.0–100.0)
PO2 ART: 76 mmHg — AB (ref 80.0–100.0)
Patient temperature: 98
Patient temperature: 37
TCO2: 50 mmol/L (ref 0–100)
TCO2: 46 mmol/L (ref 0–100)
TCO2: 46 mmol/L (ref 0–100)
pCO2 arterial: 91.3 mmHg (ref 35.0–45.0)
pH, Arterial: 7.291 — ABNORMAL LOW (ref 7.350–7.450)
pH, Arterial: 7.274 — ABNORMAL LOW (ref 7.350–7.450)

## 2016-02-16 LAB — BLOOD GAS, ARTERIAL
ACID-BASE EXCESS: 15.8 mmol/L — AB (ref 0.0–2.0)
BICARBONATE: 42.7 meq/L — AB (ref 20.0–24.0)
DRAWN BY: 365271
Delivery systems: POSITIVE
Expiratory PAP: 5
FIO2: 0.4
Inspiratory PAP: 20
O2 SAT: 94.6 %
PATIENT TEMPERATURE: 97.8
PCO2 ART: 88 mmHg — AB (ref 35.0–45.0)
PH ART: 7.305 — AB (ref 7.350–7.450)
PO2 ART: 82.5 mmHg (ref 80.0–100.0)
RATE: 12 resp/min
TCO2: 45.5 mmol/L (ref 0–100)

## 2016-02-16 LAB — BRAIN NATRIURETIC PEPTIDE: B NATRIURETIC PEPTIDE 5: 310.6 pg/mL — AB (ref 0.0–100.0)

## 2016-02-16 LAB — MRSA PCR SCREENING: MRSA by PCR: NEGATIVE

## 2016-02-16 LAB — TROPONIN I: Troponin I: 0.03 ng/mL (ref <0.031)

## 2016-02-16 MED ORDER — SODIUM CHLORIDE 0.9% FLUSH
3.0000 mL | Freq: Two times a day (BID) | INTRAVENOUS | Status: DC
  Administered 2016-02-17 – 2016-02-21 (×10): 3 mL via INTRAVENOUS

## 2016-02-16 MED ORDER — ENOXAPARIN SODIUM 40 MG/0.4ML ~~LOC~~ SOLN
40.0000 mg | SUBCUTANEOUS | Status: DC
  Administered 2016-02-17 – 2016-02-19 (×4): 40 mg via SUBCUTANEOUS
  Filled 2016-02-16 (×5): qty 0.4

## 2016-02-16 MED ORDER — SODIUM CHLORIDE 0.9 % IV SOLN: 250.0000 mL | INTRAVENOUS | Status: DC | PRN

## 2016-02-16 MED ORDER — FUROSEMIDE 10 MG/ML IJ SOLN
60.0000 mg | Freq: Once | INTRAMUSCULAR | Status: AC
  Administered 2016-02-16: 60 mg via INTRAVENOUS
  Filled 2016-02-16: qty 6

## 2016-02-16 MED ORDER — FUROSEMIDE 10 MG/ML IJ SOLN
40.0000 mg | Freq: Two times a day (BID) | INTRAMUSCULAR | Status: DC
  Administered 2016-02-17: 40 mg via INTRAVENOUS
  Filled 2016-02-16: qty 4

## 2016-02-16 MED ORDER — IOPAMIDOL (ISOVUE-370) INJECTION 76%
100.0000 mL | Freq: Once | INTRAVENOUS | Status: AC | PRN
  Administered 2016-02-16: 100 mL via INTRAVENOUS

## 2016-02-16 MED ORDER — CETYLPYRIDINIUM CHLORIDE 0.05 % MT LIQD
7.0000 mL | Freq: Two times a day (BID) | OROMUCOSAL | Status: DC
  Administered 2016-02-18 – 2016-02-20 (×6): 7 mL via OROMUCOSAL

## 2016-02-16 MED ORDER — LISINOPRIL 20 MG PO TABS
40.0000 mg | ORAL_TABLET | Freq: Every day | ORAL | Status: DC
  Administered 2016-02-17 – 2016-02-21 (×5): 40 mg via ORAL
  Filled 2016-02-16 (×5): qty 2

## 2016-02-16 MED ORDER — ONDANSETRON HCL 4 MG/2ML IJ SOLN: 4.0000 mg | Freq: Four times a day (QID) | INTRAMUSCULAR | Status: DC | PRN

## 2016-02-16 MED ORDER — ASPIRIN EC 81 MG PO TBEC
81.0000 mg | DELAYED_RELEASE_TABLET | Freq: Every day | ORAL | Status: DC
  Administered 2016-02-17 – 2016-02-21 (×5): 81 mg via ORAL
  Filled 2016-02-16 (×5): qty 1

## 2016-02-16 MED ORDER — ATORVASTATIN CALCIUM 40 MG PO TABS
40.0000 mg | ORAL_TABLET | Freq: Every day | ORAL | Status: DC
  Administered 2016-02-17 – 2016-02-21 (×5): 40 mg via ORAL
  Filled 2016-02-16 (×5): qty 1

## 2016-02-16 MED ORDER — INSULIN ASPART 100 UNIT/ML ~~LOC~~ SOLN
0.0000 [IU] | SUBCUTANEOUS | Status: DC
  Administered 2016-02-17: 15 [IU] via SUBCUTANEOUS
  Administered 2016-02-17 (×3): 3 [IU] via SUBCUTANEOUS
  Administered 2016-02-18: 20 [IU] via SUBCUTANEOUS
  Administered 2016-02-18 – 2016-02-19 (×7): 4 [IU] via SUBCUTANEOUS
  Administered 2016-02-20: 11 [IU] via SUBCUTANEOUS
  Administered 2016-02-20: 4 [IU] via SUBCUTANEOUS
  Administered 2016-02-20: 3 [IU] via SUBCUTANEOUS
  Administered 2016-02-20: 4 [IU] via SUBCUTANEOUS
  Administered 2016-02-21: 3 [IU] via SUBCUTANEOUS

## 2016-02-16 MED ORDER — CHLORHEXIDINE GLUCONATE 0.12 % MT SOLN
15.0000 mL | Freq: Two times a day (BID) | OROMUCOSAL | Status: DC
  Administered 2016-02-17 – 2016-02-21 (×7): 15 mL via OROMUCOSAL
  Filled 2016-02-16 (×8): qty 15

## 2016-02-16 MED ORDER — ACETAMINOPHEN 325 MG PO TABS: 650.0000 mg | ORAL_TABLET | ORAL | Status: DC | PRN

## 2016-02-16 MED ORDER — SODIUM CHLORIDE 0.9% FLUSH: 3.0000 mL | INTRAVENOUS | Status: DC | PRN

## 2016-02-16 MED ORDER — METOPROLOL TARTRATE 50 MG PO TABS
50.0000 mg | ORAL_TABLET | Freq: Two times a day (BID) | ORAL | Status: DC
  Administered 2016-02-17: 50 mg via ORAL
  Filled 2016-02-16: qty 1

## 2016-02-16 NOTE — ED Notes (Signed)
Patient transported to and from radiology department via stretcher. 

## 2016-02-16 NOTE — H&P (Addendum)
PCP: [facility]  [facility] Cardiology [facility] Pulmonology Jetty Duhamel Referring provider transfer from [facility].    Chief Complaint:  Leg edema  HPI: Shane Hinton is a 55 y.o. male   has a past medical history of Hypertension; Diabetes mellitus (HCC) (09/23/2015); Hyperlipidemia; and Chronic diastolic (congestive) heart failure (HCC) (11/25/2015).   Presented with 3-4 days of bilateral leg edema. Increased shortness of breath. Chest pressure that has been coming and going. He reports staying cold but no fever.Marland Kitchen He is on oxygen 2 L at home. Reports he ran out of his blood pressure medications yesterday. But reports to me that his been taking Lasix. Has hx of MOrbid obesity hypoventilation syndrome on home CPAP/BiPAP required intubation last November   IN ER: On arrival oxygen saturation is 74% his oxygen tank has ran out. ABG showing hypercarbic respiratory acidosis pH 7.291/96.8/74 chest x-ray showing no evidence of cardiopulmonary disease.  Issue [facility] M has been consulted but requested hospitalist admission and trial of BiPAP and step down Patient's blood gas was repeated showed slight worsening A continue to be somewhat somnolent but arousable Troponin 0.03 BNP 310 Regarding pertinent past history: diabetes, diastolic CHF had persistent shortness of breath undergone CT injure her chest in October 2016 showing no pulmonary embolism. Echogram in November 2016 showed normal LV function but grade 1 diastolic dysfunction. In November he was admitted for respiratory distress in the setting of hypercarbia had to be intubated. His then seen by pulmonology for mild obstructive sleep apnea he was noted to have significant oxygen desaturation.  Hospitalist was called for admission for Hypercarbic respiratory failure  Review of Systems:    Pertinent positives include: chest pain,  Bilateral lower extremity swelling  shortness of breath at rest. dyspnea on  exertion,  Constitutional:  No weight loss, night sweats, Fevers, chills, fatigue, weight loss  HEENT:  No headaches, Difficulty swallowing,Tooth/dental problems,Sore throat,  No sneezing, itching, ear ache, nasal congestion, post nasal drip,  Cardio-vascular:  No  Orthopnea, PND, anasarca, dizziness, palpitations.no GI:  No heartburn, indigestion, abdominal pain, nausea, vomiting, diarrhea, change in bowel habits, loss of appetite, melena, blood in stool, hematemesis Resp:  no No No excess mucus, no productive cough, No non-productive cough, No coughing up of blood.No change in color of mucus.No wheezing. Skin:  no rash or lesions. No jaundice GU:  no dysuria, change in color of urine, no urgency or frequency. No straining to urinate.  No flank pain.  Musculoskeletal:  No joint pain or no joint swelling. No decreased range of motion. No back pain.  Psych:  No change in mood or affect. No depression or anxiety. No memory loss.  Neuro: no localizing neurological complaints, no tingling, no weakness, no double vision, no gait abnormality, no slurred speech, no confusion  Otherwise ROS are negative except for above, 10 systems were reviewed  Past Medical History: Past Medical History  Diagnosis Date  . Hypertension   . Diabetes mellitus (HCC) 09/23/2015  . Hyperlipidemia   . Chronic diastolic (congestive) heart failure (HCC) 11/25/2015   Past Surgical History  Procedure Laterality Date  . No previous surgery       Medications: Prior to Admission medications   Medication Sig Start Date End Date Taking? Authorizing Provider  aspirin EC 81 MG tablet Take 1 tablet (81 mg total) by mouth daily. 10/31/15   Jaclyn Shaggy, MD  atorvastatin (LIPITOR) 40 MG tablet Take 1 tablet (40 mg total) by mouth daily. 11/25/15  Lewayne Bunting, MD  Blood Glucose Monitoring Suppl (TRUE METRIX METER) DEVI 1 each by Does not apply route daily. 09/23/15   Jaclyn Shaggy, MD  furosemide (LASIX) 40 MG  tablet Take 1 tablet (40 mg total) by mouth daily. 09/23/15   Jaclyn Shaggy, MD  gabapentin (NEURONTIN) 300 MG capsule Take 1 capsule (300 mg total) by mouth 2 (two) times daily. 12/03/15   Jaclyn Shaggy, MD  glucose blood (TRUE METRIX BLOOD GLUCOSE TEST) test strip Use as instructed 09/23/15   Jaclyn Shaggy, MD  lisinopril (PRINIVIL,ZESTRIL) 40 MG tablet Take 40 mg by mouth daily.    Historical Provider, MD  metFORMIN (GLUCOPHAGE) 500 MG tablet Take 1 tablet (500 mg total) by mouth 2 (two) times daily with a meal. 12/03/15   Jaclyn Shaggy, MD  metoprolol (LOPRESSOR) 50 MG tablet Take 1 tablet (50 mg total) by mouth 2 (two) times daily. 12/05/15   Chilton Si, MD  nitroGLYCERIN (NITROSTAT) 0.4 MG SL tablet Place 1 tablet (0.4 mg total) under the tongue every 5 (five) minutes as needed for chest pain. 10/31/15   Jaclyn Shaggy, MD  potassium chloride 20 MEQ TBCR Take 20 mEq by mouth daily as needed (if you take Furosemide (Lasix)). 09/23/15   Jaclyn Shaggy, MD  TRUEPLUS LANCETS 28G MISC 1 each by Does not apply route daily. 09/23/15   Jaclyn Shaggy, MD    Allergies:  No Known Allergies  Social History:  Ambulatory  independently but needs a cane Lives at home roomate     reports that he has never smoked. He does not have any smokeless tobacco history on file. He reports that he does not drink alcohol or use illicit drugs.     Family History: family history includes Diabetes in his mother; Hypertension in his mother; Kidney disease in his mother.    Physical Exam: Patient Vitals for the past 24 hrs:  BP Temp Temp src Pulse Resp SpO2 Height Weight  02/16/16 2124 140/73 mmHg 97.8 F (36.6 C) Axillary 89 13 99 %  (1.727 m) 130.4 kg (287 lb 7.7 oz)  02/16/16 2119 - - - - (!) 28 97 % - -  02/16/16 1925 137/86 mmHg - - 88 22 100 % - -  02/16/16 1840 147/98 mmHg - - 86 23 99 % - -  02/16/16 1730 (!) 156/101 mmHg 98.5 F (36.9 C) Oral 85 24 97 % - -  02/16/16 1704 157/95 mmHg - - 90 24 100  % - -  02/16/16 1641 - - - 90 - 96 % - -  02/16/16 1630 (!) 152/108 mmHg - - 91 - 99 % - -  02/16/16 1555 142/80 mmHg - - 94 24 93 % - -  02/16/16 1538 - - - - - 100 % - -  02/16/16 1451 167/100 mmHg - - 97 25 99 % - -  02/16/16 1417 126/78 mmHg - - 97 17 97 % - -  02/16/16 1300 140/88 mmHg - - 93 22 96 % - -  02/16/16 1156 129/77 mmHg 98.3 F (36.8 C) Oral 97 24 (!) 74 %  (1.727 m) 136.079 kg (300 lb)    1. General:  in No Acute distress 2. Psychological: lethargic but Oriented  3. Head/ENT:   Moist  Mucous Membranes                          Head Non traumatic, neck supple  Normal   Dentition 4. SKIN: normal Skin turgor,  Skin clean Dry and intact no rash 5. Heart: Regular rate and rhythm no  Murmur, Rub or gallop 6. Lungs:   no wheezes or crackles  distant breath sounds 7. Abdomen: Soft, non-tender,  distended 8. Lower extremities: no clubbing, cyanosis,  edema 9. Neurologically Grossly intact, moving all 4 extremities equally 10. MSK: Normal range of motion  body mass index is 43.72 kg/(m^2).   Labs on Admission:   Results for orders placed or performed during the hospital encounter of 02/16/16 (from the past 24 hour(s))  CBC with Differential     Status: None   Collection Time: 02/16/16 12:25 PM  Result Value Ref Range   WBC 6.4 4.0 - 10.5 K/uL   RBC 5.09 4.22 - 5.81 MIL/uL   Hemoglobin 15.3 13.0 - 17.0 g/dL   HCT 16.1 09.6 - 04.5 %   MCV 94.5 78.0 - 100.0 fL   MCH 30.1 26.0 - 34.0 pg   MCHC 31.8 30.0 - 36.0 g/dL   RDW 40.9 81.1 - 91.4 %   Platelets 243 150 - 400 K/uL   Neutrophils Relative % 62 %   Neutro Abs 4.0 1.7 - 7.7 K/uL   Lymphocytes Relative 26 %   Lymphs Abs 1.7 0.7 - 4.0 K/uL   Monocytes Relative 12 %   Monocytes Absolute 0.7 0.1 - 1.0 K/uL   Eosinophils Relative 1 %   Eosinophils Absolute 0.1 0.0 - 0.7 K/uL   Basophils Relative 0 %   Basophils Absolute 0.0 0.0 - 0.1 K/uL  Basic metabolic panel     Status: Abnormal    Collection Time: 02/16/16 12:25 PM  Result Value Ref Range   Sodium 138 135 - 145 mmol/L   Potassium 3.5 3.5 - 5.1 mmol/L   Chloride 97 (L) 101 - 111 mmol/L   CO2 39 (H) 22 - 32 mmol/L   Glucose, Bld 218 (H) 65 - 99 mg/dL   BUN 27 (H) 6 - 20 mg/dL   Creatinine, Ser 7.82 0.61 - 1.24 mg/dL   Calcium 8.6 (L) 8.9 - 10.3 mg/dL   GFR calc non Af Amer >60 >60 mL/min   GFR calc Af Amer >60 >60 mL/min   Anion gap <3 (L) 5 - 15  Troponin I     Status: None   Collection Time: 02/16/16 12:25 PM  Result Value Ref Range   Troponin I 0.03 <0.031 ng/mL  Brain natriuretic peptide     Status: Abnormal   Collection Time: 02/16/16 12:25 PM  Result Value Ref Range   B Natriuretic Peptide 310.6 (H) 0.0 - 100.0 pg/mL  I-Stat arterial blood gas, ED     Status: Abnormal   Collection Time: 02/16/16  2:25 PM  Result Value Ref Range   pH, Arterial 7.291 (L) 7.350 - 7.450   pCO2 arterial 96.8 (HH) 35.0 - 45.0 mmHg   pO2, Arterial 74.0 (L) 80.0 - 100.0 mmHg   Bicarbonate 46.8 (H) 20.0 - 24.0 mEq/L   TCO2 50 0 - 100 mmol/L   O2 Saturation 92.0 %   Acid-Base Excess 14.0 (H) 0.0 - 2.0 mmol/L   Patient temperature 98.0 F    Sample type ARTERIAL    Comment NOTIFIED PHYSICIAN   I-Stat arterial blood gas, ED     Status: Abnormal   Collection Time: 02/16/16  3:54 PM  Result Value Ref Range   pH, Arterial 7.281 (L) 7.350 - 7.450   pCO2 arterial 91.3 (HH)  35.0 - 45.0 mmHg   pO2, Arterial 67.0 (L) 80.0 - 100.0 mmHg   Bicarbonate 43.0 (H) 20.0 - 24.0 mEq/L   TCO2 46 0 - 100 mmol/L   O2 Saturation 89.0 %   Acid-Base Excess 10.0 (H) 0.0 - 2.0 mmol/L   Patient temperature HIDE    Sample type ARTERIAL    Comment NOTIFIED PHYSICIAN   I-Stat arterial blood gas, ED     Status: Abnormal   Collection Time: 02/16/16  5:56 PM  Result Value Ref Range   pH, Arterial 7.274 (L) 7.350 - 7.450   pCO2 arterial 94.0 (HH) 35.0 - 45.0 mmHg   pO2, Arterial 76.0 (L) 80.0 - 100.0 mmHg   Bicarbonate 43.6 (H) 20.0 - 24.0 mEq/L    TCO2 46 0 - 100 mmol/L   O2 Saturation 91.0 %   Acid-Base Excess 11.0 (H) 0.0 - 2.0 mmol/L   Patient temperature 37.0 C    Sample type ARTERIAL    Comment NOTIFIED PHYSICIAN     UA not obtained  Lab Results  Component Value Date   HGBA1C 8.10 12/03/2015    Estimated Creatinine Clearance: 99.9 mL/min (by C-G formula based on Cr of 1.14).  BNP (last 3 results) No results for input(s): PROBNP in the last 8760 hours.  Other results:  I have pearsonaly reviewed this: ECG REPORT  Rate:97  Rhythm: SR with right side deviation ST&T Change: T wve inversions in septal leads QTC 475  Filed Weights   02/16/16 1156 02/16/16 2124  Weight: 136.079 kg (300 lb) 130.4 kg (287 lb 7.7 oz)     Cultures:    Component Value Date/Time   SDES BLOOD RIGHT HAND 09/08/2015 1040   SPECREQUEST BOTTLES DRAWN AEROBIC AND ANAEROBIC  10 CC 09/08/2015 1040   CULT  09/08/2015 1040    NO GROWTH 5 DAYS Performed at [facility]    REPTSTATUS 09/13/2015 FINAL 09/08/2015 1040     Radiological Exams on Admission: Dg Chest 2 View  02/16/2016  CLINICAL DATA:  Cough and shortness of breath. Decreased left-sided breath sounds. EXAM: CHEST  2 VIEW COMPARISON:  09/10/2015 and 01/22/2015 and chest CT dated 09/08/2015 FINDINGS: Heart size and pulmonary vascularity are normal. The patient has a severe thoracolumbar scoliosis with chronic compressive atelectasis at the right lung base medially. No infiltrates or effusions.  No acute bone abnormality. IMPRESSION: No active cardiopulmonary disease. Electronically Signed   By: Francene Boyers M.D.   On: 02/16/2016 12:24   Ct Angio Chest Pe W/cm &/or Wo Cm  02/16/2016  CLINICAL DATA:  Hypoxia EXAM: CT ANGIOGRAPHY CHEST WITH CONTRAST TECHNIQUE: Multidetector CT imaging of the chest was performed using the standard protocol during bolus administration of intravenous contrast. Multiplanar CT image reconstructions and MIPs were obtained to evaluate the vascular  anatomy. CONTRAST:  100 mL Isovue 370 IV COMPARISON:  Chest x-ray 02/16/2016.  CT chest 09/08/2015 FINDINGS: Negative for pulmonary embolism. Normal aortic arch. Mild cardiac enlargement. Coronary calcification in the left coronary artery. Marked scoliosis in the thoracic spine. Mild atelectasis in lung bases, improved from 09/08/2015. Negative for pneumonia. No pleural effusion. Negative for mass or adenopathy. Upper abdomen negative Review of the MIP images confirms the above findings. IMPRESSION: Negative for pulmonary embolism Mild bibasilar atelectasis. Moderate scoliosis. Electronically Signed   By: Marlan Palau M.D.   On: 02/16/2016 14:20    Chart has been reviewed  Family not  at  Bedside    Assessment/Plan  55 year old gentleman with history of  diastolic heart failure history of hypoventilatory syndrome admitted to stepdown with hypercarbia An acute respiratory acidosis on BiPAP Present on Admission:  . Hypercarbia continual BiPAP repeat ABG be CCM consult to help manage  . Acute respiratory failure with hypoxia and hypercapnia (HCC) - currently remains arousable need to carefully monitor while on BiPAP . Shooting for pH 7.35 . acute on Chronic diastolic (congestive) heart failure (HCC) appears to have CHF exacerbation we'll repeat echogram given IV Lasix monitor on telemetry cycle cardiac enzymes. Can use intermittent Diamox instead of Lasix of Bicarb is going up.  . Essential (primary) hypertension stable resume home medications  . Morbid obesity [facility](HCC) dietitian consult . Obesity hypoventilation syndrome (HCC) contributing to hypercarbia likely  Diabetes mellitus hold by mouth medications order sliding scale  Prophylaxis:  Lovenox   CODE STATUS:  FULL CODE as per patient   Disposition:  likely will need placement for rehabilitation                   Other plan as per orders.  I have spent a total of 65 min on this admission extra time was spent to discuss case with PCCM Dr.  Angelica RanSummer  Kerstie Hinton 02/16/2016, 10:24 PM    [facility]  Pager [phone]   after 2 AM please page floor coverage PA If 7AM-7PM, please contact the day team taking care of the patient  [email or web address]  Password [email or web address]

## 2016-02-16 NOTE — Progress Notes (Signed)
Called to attempt report X1

## 2016-02-16 NOTE — ED Provider Notes (Signed)
CSN: [id number]     Arrival date & time 02/16/16  1149 History   First MD Initiated Contact with Patient 02/16/16 1150     Chief Complaint  Patient presents with  . Shortness of Breath     (Consider location/radiation/quality/duration/timing/severity/associated sxs/prior Treatment) HPI Comments: 55 year old male with history of CHF, hypertension, diabetes mellitus, hyperlipidemia, chronic oxygen dependence on 2 L nasal cannula presents for shortness of breath and leg swelling. The patient reports that the shortness of breath just started today. He has noted swelling in his legs for an extended period of time but worse over the last 2 days. He said that he is no longer able to walk around the block because of his swelling and shortness of breath. When he arrived his O2 saturation was 174% and was found that his travel oxygen tank had run out. Patient denies chest pain. No recent fevers or cough. No recent sick contacts.  Patient is a 55 y.o. male presenting with shortness of breath.  Shortness of Breath Associated symptoms: no abdominal pain, no chest pain, no cough, no fever, no headaches, no rash, no vomiting and no wheezing     Past Medical History  Diagnosis Date  . Hypertension   . Diabetes mellitus (HCC) 09/23/2015  . Hyperlipidemia   . Chronic diastolic (congestive) heart failure (HCC) 11/25/2015   Past Surgical History  Procedure Laterality Date  . No previous surgery     Family History  Problem Relation Age of Onset  . Diabetes Mother   . Hypertension Mother   . Kidney disease Mother    Social History  Substance Use Topics  . Smoking status: Never Smoker   . Smokeless tobacco: None  . Alcohol Use: No    Review of Systems  Constitutional: Negative for fever, chills and fatigue.  HENT: Negative for congestion, postnasal drip and rhinorrhea.   Eyes: Negative for visual disturbance.  Respiratory: Positive for shortness of breath. Negative for cough, chest tightness and  wheezing.   Cardiovascular: Positive for leg swelling. Negative for chest pain and palpitations.  Gastrointestinal: Negative for nausea, vomiting, abdominal pain, diarrhea and constipation.  Genitourinary: Negative for dysuria, urgency and frequency.  Musculoskeletal: Negative for myalgias and back pain.  Skin: Negative for rash.  Neurological: Negative for dizziness and headaches.  Hematological: Does not bruise/bleed easily.      Allergies  Review of patient's allergies indicates no known allergies.  Home Medications   Prior to Admission medications   Medication Sig Start Date End Date Taking? Authorizing Provider  aspirin EC 81 MG tablet Take 1 tablet (81 mg total) by mouth daily. 10/31/15   Jaclyn Shaggy, MD  atorvastatin (LIPITOR) 40 MG tablet Take 1 tablet (40 mg total) by mouth daily. 11/25/15   Lewayne Bunting, MD  Blood Glucose Monitoring Suppl (TRUE METRIX METER) DEVI 1 each by Does not apply route daily. 09/23/15   Jaclyn Shaggy, MD  furosemide (LASIX) 40 MG tablet Take 1 tablet (40 mg total) by mouth daily. 09/23/15   Jaclyn Shaggy, MD  gabapentin (NEURONTIN) 300 MG capsule Take 1 capsule (300 mg total) by mouth 2 (two) times daily. 12/03/15   Jaclyn Shaggy, MD  glucose blood (TRUE METRIX BLOOD GLUCOSE TEST) test strip Use as instructed 09/23/15   Jaclyn Shaggy, MD  lisinopril (PRINIVIL,ZESTRIL) 40 MG tablet Take 40 mg by mouth daily.    Historical Provider, MD  metFORMIN (GLUCOPHAGE) 500 MG tablet Take 1 tablet (500 mg total) by mouth 2 (two) times daily  with a meal. 12/03/15   Jaclyn ShaggyEnobong Amao, MD  metoprolol (LOPRESSOR) 50 MG tablet Take 1 tablet (50 mg total) by mouth 2 (two) times daily. 12/05/15   Chilton Siiffany Ecru, MD  nitroGLYCERIN (NITROSTAT) 0.4 MG SL tablet Place 1 tablet (0.4 mg total) under the tongue every 5 (five) minutes as needed for chest pain. 10/31/15   Jaclyn ShaggyEnobong Amao, MD  potassium chloride 20 MEQ TBCR Take 20 mEq by mouth daily as needed (if you take Furosemide  (Lasix)). 09/23/15   Jaclyn ShaggyEnobong Amao, MD  TRUEPLUS LANCETS 28G MISC 1 each by Does not apply route daily. 09/23/15   Jaclyn ShaggyEnobong Amao, MD   BP 142/80 mmHg  Pulse 90  Temp(Src) 98.3 F (36.8 C) (Oral)  Resp 24  Ht 5\' 8"  (1.727 m)  Wt 300 lb (136.079 kg)  BMI 45.63 kg/m2  SpO2 96% Physical Exam  Constitutional: He is oriented to person, place, and time. He appears well-nourished.  HENT:  Head: Normocephalic and atraumatic.  Eyes: EOM are normal. Pupils are equal, round, and reactive to light.  Neck: Normal range of motion. Neck supple.  Cardiovascular: Regular rhythm and intact distal pulses.  Tachycardia present.   No murmur heard. Pulmonary/Chest: Accessory muscle usage present. Tachypnea noted. He has decreased breath sounds in the left upper field, the left middle field and the left lower field. He has no wheezes. He has rales.  Abdominal: Soft. He exhibits distension. There is no tenderness.  Edema over the abdominal wall  Musculoskeletal: He exhibits edema (2+ pitting of the bilateral lower extremities).  Neurological: He is alert and oriented to person, place, and time.  Skin: Skin is warm and dry. No rash noted. He is not diaphoretic.  Vitals reviewed.   ED Course  Procedures (including critical care time)  CRITICAL CARE Performed by: Larna DaughtersEmily R Nguyen   Total critical care time: 45 minutes  Critical care time was exclusive of separately billable procedures and treating other patients.  Critical care was necessary to treat or prevent imminent or life-threatening deterioration.  Critical care was time spent personally by me on the following activities: development of treatment plan with patient and/or surrogate as well as nursing, discussions with consultants, evaluation of patient's response to treatment, examination of patient, obtaining history from patient or surrogate, ordering and performing treatments and interventions, ordering and review of laboratory studies, ordering  and review of radiographic studies, pulse oximetry and re-evaluation of patient's condition.  Labs Review Labs Reviewed  BASIC METABOLIC PANEL - Abnormal; Notable for the following:    Chloride 97 (*)    CO2 39 (*)    Glucose, Bld 218 (*)    BUN 27 (*)    Calcium 8.6 (*)    Anion gap <3 (*)    All other components within normal limits  BRAIN NATRIURETIC PEPTIDE - Abnormal; Notable for the following:    B Natriuretic Peptide 310.6 (*)    All other components within normal limits  I-STAT ARTERIAL BLOOD GAS, ED - Abnormal; Notable for the following:    pH, Arterial 7.291 (*)    pCO2 arterial 96.8 (*)    pO2, Arterial 74.0 (*)    Bicarbonate 46.8 (*)    Acid-Base Excess 14.0 (*)    All other components within normal limits  I-STAT ARTERIAL BLOOD GAS, ED - Abnormal; Notable for the following:    pH, Arterial 7.281 (*)    pCO2 arterial 91.3 (*)    pO2, Arterial 67.0 (*)    Bicarbonate 43.0 (*)  Acid-Base Excess 10.0 (*)    All other components within normal limits  CBC WITH DIFFERENTIAL/PLATELET  TROPONIN I  BLOOD GAS, ARTERIAL    Imaging Review Dg Chest 2 View  02/16/2016  CLINICAL DATA:  Cough and shortness of breath. Decreased left-sided breath sounds. EXAM: CHEST  2 VIEW COMPARISON:  09/10/2015 and 01/22/2015 and chest CT dated 09/08/2015 FINDINGS: Heart size and pulmonary vascularity are normal. The patient has a severe thoracolumbar scoliosis with chronic compressive atelectasis at the right lung base medially. No infiltrates or effusions.  No acute bone abnormality. IMPRESSION: No active cardiopulmonary disease. Electronically Signed   By: Francene Boyers M.D.   On: 02/16/2016 12:24   Ct Angio Chest Pe W/cm &/or Wo Cm  02/16/2016  CLINICAL DATA:  Hypoxia EXAM: CT ANGIOGRAPHY CHEST WITH CONTRAST TECHNIQUE: Multidetector CT imaging of the chest was performed using the standard protocol during bolus administration of intravenous contrast. Multiplanar CT image reconstructions and  MIPs were obtained to evaluate the vascular anatomy. CONTRAST:  100 mL Isovue 370 IV COMPARISON:  Chest x-ray 02/16/2016.  CT chest 09/08/2015 FINDINGS: Negative for pulmonary embolism. Normal aortic arch. Mild cardiac enlargement. Coronary calcification in the left coronary artery. Marked scoliosis in the thoracic spine. Mild atelectasis in lung bases, improved from 09/08/2015. Negative for pneumonia. No pleural effusion. Negative for mass or adenopathy. Upper abdomen negative Review of the MIP images confirms the above findings. IMPRESSION: Negative for pulmonary embolism Mild bibasilar atelectasis. Moderate scoliosis. Electronically Signed   By: Marlan Palau M.D.   On: 02/16/2016 14:20   I have personally reviewed and evaluated these images and lab results as part of my medical decision-making.   EKG Interpretation   Date/Time:  Monday February 16 2016 12:03:44 EDT Ventricular Rate:  97 PR Interval:  138 QRS Duration: 106 QT Interval:  374 QTC Calculation: 475 R Axis:   96 Text Interpretation:  Sinus rhythm Consider right atrial enlargement  Borderline right axis deviation No significant change since last tracing  Confirmed by NGUYEN, EMILY ([id number]) on 02/16/2016 12:14:02 PM      MDM  Patient was seen and evaluated at bedside. Patient was hypoxic on arrival but his O2 tank was noted to have run out. Patient extremely tired but arousable. Concern for fluid overload based upon examination and patient was given 60 mg of IV Lasix. Chest x-ray without acute finding or pulmonary edema. BNP in the mid 300s. ABG with a pH of 7.286 and a PCO2 of 98.2 and so patient was started on BiPAP. CTA was negative for PE. Case was discussed with Dr. Vaughan Basta of critical care. He agreed with plan of care at this time. He recommended continuation of care repeat ABG. Repeat ABG relatively unchanged after over an hour of BiPAP. Discussed with Dr. Margot Ables who said he agreed with admission to stepdown at this point but  would recommend a repeat ABG at 6 PM. As it may take a while for the patient to get of bed the case was discussed with Dr. Lynelle Doctor who is taking over ER coverage. If the ABG comes back with a worsened pH the patient may end up needing intubation but will definitely need to go to critical care for admission. Patient and sister were updated on results and plan of care at bedside. Admission order placed to stepdown at this time. Final diagnoses:  Hypercarbia  Acute congestive heart failure, unspecified congestive heart failure type (HCC)    1. Hypercarbia 2. Congestive heart failure  Leta Baptist, MD 02/16/16 [phone]

## 2016-02-16 NOTE — ED Notes (Signed)
EDP notified of ABG results

## 2016-02-16 NOTE — ED Notes (Signed)
Transport team has arrived to transport patient.

## 2016-02-16 NOTE — ED Notes (Addendum)
Sob. Home oxygen. Swelling in his legs and feet x 2 days. He ran out of BP meds yesterday.

## 2016-02-16 NOTE — ED Provider Notes (Signed)
ABG is essentially unchanged or a little worse.  Updated family and patient.  They would still like to avoid intubation.  Oxygen saturation is in the mid 90s.  Will continue to monitor.  He remains somnolent and does not seem to be responding to Bipap so far.  Linwood DibblesJon Ceasia Elwell, MD 02/16/16 [phone]

## 2016-02-16 NOTE — ED Notes (Signed)
[facility] nurse will call back when available.

## 2016-02-16 NOTE — ED Notes (Signed)
Patient on bipap at this time. Patient report feeling "uncomfortable". Patient noticed to be breathing irregularly on bipap. Patient has +2 pitting edema to BLE with a very tight and distended abdomen as well. RT at bedside frequently to monitor patient along with this nurse. Family at bedside. Call bell within reach, bed locked in lowest position and patient alert at this time.

## 2016-02-16 NOTE — ED Notes (Signed)
Report given to Casimiro NeedleMichael, [facility]

## 2016-02-16 NOTE — ED Notes (Signed)
MD at bedside with patient

## 2016-02-16 NOTE — Progress Notes (Signed)
Potential admission for [facility] [place]. Patient presenting in acute respiratory failure with ABG showing pH 7.28, PCO2 91.3, PO2 67, bicarbonate 43. Patient on BiPAP. BP initially consult to CCM feels like patient is still currently a candidate for stepdown admission which would fall under [facility]. No stepdown beds available but will take patient if patient's rotatory status remains stable. ABGs to be repeated at 1800 if at that time ABG continues to show decompensation patient will need to go to critical care medicine for admission. Currently patient is on BiPAP.  Shelly Flattenavid Jayel Scaduto, MD [facility] 02/16/2016, 4:35 PM

## 2016-02-17 ENCOUNTER — Inpatient Hospital Stay (HOSPITAL_COMMUNITY): Payer: Medicaid Other

## 2016-02-17 ENCOUNTER — Encounter (HOSPITAL_COMMUNITY): Payer: Self-pay | Admitting: Pulmonary Disease

## 2016-02-17 DIAGNOSIS — I509 Heart failure, unspecified: Secondary | ICD-10-CM

## 2016-02-17 DIAGNOSIS — G4733 Obstructive sleep apnea (adult) (pediatric): Secondary | ICD-10-CM

## 2016-02-17 DIAGNOSIS — Z9989 Dependence on other enabling machines and devices: Secondary | ICD-10-CM

## 2016-02-17 DIAGNOSIS — E662 Morbid (severe) obesity with alveolar hypoventilation: Secondary | ICD-10-CM

## 2016-02-17 DIAGNOSIS — J9622 Acute and chronic respiratory failure with hypercapnia: Secondary | ICD-10-CM | POA: Insufficient documentation

## 2016-02-17 DIAGNOSIS — J810 Acute pulmonary edema: Secondary | ICD-10-CM | POA: Insufficient documentation

## 2016-02-17 LAB — BLOOD GAS, ARTERIAL
ACID-BASE EXCESS: 15 mmol/L — AB (ref 0.0–2.0)
BICARBONATE: 42.7 meq/L — AB (ref 20.0–24.0)
DELIVERY SYSTEMS: POSITIVE
DRAWN BY: 365271
Expiratory PAP: 5
FIO2: 0.6
Inspiratory PAP: 20
O2 Saturation: 96.2 %
PH ART: 7.253 — AB (ref 7.350–7.450)
Patient temperature: 97.8
RATE: 12 resp/min
TCO2: 45.8 mmol/L (ref 0–100)
pCO2 arterial: 99.3 mmHg (ref 35.0–45.0)
pO2, Arterial: 99.7 mmHg (ref 80.0–100.0)

## 2016-02-17 LAB — BASIC METABOLIC PANEL
ANION GAP: 6 (ref 5–15)
BUN: 17 mg/dL (ref 6–20)
CALCIUM: 8.6 mg/dL — AB (ref 8.9–10.3)
CO2: 43 mmol/L — AB (ref 22–32)
Chloride: 94 mmol/L — ABNORMAL LOW (ref 101–111)
Creatinine, Ser: 1.17 mg/dL (ref 0.61–1.24)
GFR calc Af Amer: >60 mL/min (ref >60)
GFR calc non Af Amer: >60 mL/min (ref >60)
GLUCOSE: 172 mg/dL — AB (ref 65–99)
Potassium: 3.9 mmol/L (ref 3.5–5.1)
Sodium: 143 mmol/L (ref 135–145)

## 2016-02-17 LAB — GLUCOSE, CAPILLARY
GLUCOSE-CAPILLARY: 125 mg/dL — AB (ref 65–99)
GLUCOSE-CAPILLARY: 142 mg/dL — AB (ref 65–99)
GLUCOSE-CAPILLARY: 325 mg/dL — AB (ref 65–99)
Glucose-Capillary: 140 mg/dL — ABNORMAL HIGH (ref 65–99)
Glucose-Capillary: 118 mg/dL — ABNORMAL HIGH (ref 65–99)
Glucose-Capillary: 94 mg/dL (ref 65–99)

## 2016-02-17 LAB — ECHOCARDIOGRAM COMPLETE
HEIGHTINCHES: 68 in
WEIGHTICAEL: 4585.57 [oz_av]

## 2016-02-17 LAB — TROPONIN I

## 2016-02-17 MED ORDER — METOPROLOL TARTRATE 1 MG/ML IV SOLN
5.0000 mg | Freq: Four times a day (QID) | INTRAVENOUS | Status: DC
  Administered 2016-02-17 – 2016-02-18 (×3): 5 mg via INTRAVENOUS
  Filled 2016-02-17 (×3): qty 5

## 2016-02-17 MED ORDER — FUROSEMIDE 10 MG/ML IJ SOLN
40.0000 mg | Freq: Three times a day (TID) | INTRAMUSCULAR | Status: DC
  Administered 2016-02-17 – 2016-02-18 (×3): 40 mg via INTRAVENOUS
  Filled 2016-02-17 (×3): qty 4

## 2016-02-17 MED ORDER — PERFLUTREN LIPID MICROSPHERE
1.0000 mL | INTRAVENOUS | Status: AC | PRN
  Administered 2016-02-17: 2 mL via INTRAVENOUS
  Filled 2016-02-17: qty 10

## 2016-02-17 NOTE — Progress Notes (Signed)
  Echocardiogram 2D Echocardiogram has been performed.  Leta JunglingCooper, Jaiveon Suppes M 02/17/2016, 12:37 PM

## 2016-02-17 NOTE — Progress Notes (Signed)
Came back to review ABG.  PH of 7.35 with elevated CO2 and low PaO2, appears venous but pH remains well non-the-less.  Patient wants to eat.  Will place on [place] O2 and if mental status remains clear in 1 hour may take PO and place on BiPAP QHS.  Alyson ReedyWesam G. Nancy Manuele, M.D. [facility]. Pager: [phone]. After hours pager: [phone].

## 2016-02-17 NOTE — Progress Notes (Signed)
Talked to patient at beginning of shift about wearing BIPAP at night. Patient understood and was willing to wear tonight. Patient is not ready for BIPAP at this time. Per RN has been on the phone talking and requesting food. Pt has agreed to go on shortly. Vitals remain stable, pt awake and alert. No distress noted, not lethargic. RT will continue to monitor.

## 2016-02-17 NOTE — Progress Notes (Addendum)
[facility] Progress Note                                            Patient Demographics:    Shane Hinton, is a 55 y.o. male, DOB - 11-02-1961, RUE:[id number]  Admit date - 02/16/2016   Admitting Physician Therisa Doyne, MD  Outpatient Primary MD for the patient is [facility]  55 yo male with DM/HTN/CHF, obesity, OSA/OHS  presented to ER with dyspnea and leg swelling x 2 days. He had run out of his BP meds and lasix. He was hypoxic in ER, and ABG showed acute on chronic hypercapnia. He was started on BiPAP and admitted to SDU. Clinically felt to be volume overloaded and diuresing. Remains acidotic and hypercarbic-but mentation stable, PCCM consulting    Subjective:  Breathing improving, wants to eat   Assessment  & Plan :     Acute respiratory failure with hypoxia and hypercapnia (HCC) -due to Diastolic CHF and OSA/OHS -on BIPAP with acute resp acidosis-unchanged, PCCM consulting, mentation ok -continue IV lasix, will increase dose   Acute on chronic diastolic CHF -as above, FU ECHO   Essential (primary) hypertension -stable, on lisinopril, metoprolol and lasix    Diabetes mellitus (HCC) -hold metformin, SSI   OSA/OHS -s/p sleep study in 2/17 and started on CPAP, not sure if he's using this or not, will d/w pt once off BIPAP DVT proph: lovenox  Code Status : Full Code  Family Communication  : no family at bedside  Disposition Plan  : keep in SDU  Consults  :  PCCM  Procedures  : BIPAP  Lab Results  Component Value Date   PLT 243 02/16/2016    Anti-infectives    None        Objective:   Filed Vitals:   02/17/16 0327 02/17/16 0453 02/17/16 0458 02/17/16 0728  BP:  142/71  141/72  Pulse: 77 82  85  Temp:  98.4 F (36.9 C)  98.3 F (36.8 C)  TempSrc:  Axillary  Axillary  Resp: Height:      Weight:    130 kg (286 lb 9.6 oz)   SpO2: 97% 98%  97%    Wt Readings from Last 3 Encounters:  02/17/16 130 kg (286 lb 9.6 oz)  12/31/15 127.007 kg (280 lb)  12/05/15 127.007 kg (280 lb)     Intake/Output Summary (Last 24 hours) at 02/17/16 1129 Last data filed at 02/17/16 0618  Gross per 24 hour  Intake      0 ml  Output    980 ml  Net   -980 ml     Physical Exam  Awake Alert, Oriented X 3, on BIPAP HEENT: Supple Neck,No JVD, No cervical lymphadenopathy appreciated.  Lungs: diminished BS at bases CVS: RRR,No Gallops,Rubs or new Murmurs, No Parasternal Heave Abd: +ve B.Sounds, Abd Soft, No tenderness, No organomegaly appriciated, No rebound - guarding or rigidity. Ext: 1plus edema    Data Review:    CBC  Recent Labs Lab 02/16/16 1225  WBC 6.4  HGB 15.3  HCT 48.1  PLT 243  MCV 94.5  MCH 30.1  MCHC 31.8  RDW 15.0  LYMPHSABS 1.7  MONOABS 0.7  EOSABS 0.1  BASOSABS 0.0    Chemistries   Recent Labs Lab 02/16/16 1225 02/17/16 0527  NA 138 143  K 3.5 3.9  CL 97* 94*  CO2 39* 43*  GLUCOSE 218* 172*  BUN 27* 17  CREATININE 1.14 1.17  CALCIUM 8.6* 8.6*   ------------------------------------------------------------------------------------------------------------------ No results for input(s): CHOL, HDL, LDLCALC, TRIG, CHOLHDL, LDLDIRECT in the last 72 hours.  Lab Results  Component Value Date   HGBA1C 8.10 12/03/2015   ------------------------------------------------------------------------------------------------------------------ No results for input(s): TSH, T4TOTAL, T3FREE, THYROIDAB in the last 72 hours.  Invalid input(s): FREET3 ------------------------------------------------------------------------------------------------------------------ No results for input(s): VITAMINB12, FOLATE, FERRITIN, TIBC, IRON, RETICCTPCT in the last 72 hours.  Coagulation profile No results for input(s): INR, PROTIME in the last 168 hours.  No results for input(s):  DDIMER in the last 72 hours.  Cardiac Enzymes  Recent Labs Lab 02/16/16 1225 02/16/16 2208 02/17/16 0527  TROPONINI 0.03 <0.03 <0.03   ------------------------------------------------------------------------------------------------------------------    Component Value Date/Time   BNP 310.6* 02/16/2016 1225    Inpatient Medications  Scheduled Meds: . antiseptic oral rinse  7 mL Mouth Rinse q12n4p  . aspirin EC  81 mg Oral Daily  . atorvastatin  40 mg Oral Daily  . chlorhexidine  15 mL Mouth Rinse BID  . enoxaparin (LOVENOX) injection  40 mg Subcutaneous Q24H  . furosemide  40 mg Intravenous BID  . insulin aspart  0-20 Units Subcutaneous 6 times per day  . lisinopril  40 mg Oral Daily  . metoprolol  50 mg Oral BID  . sodium chloride flush  3 mL Intravenous Q12H   Continuous Infusions:  PRN Meds:.sodium chloride, acetaminophen, ondansetron (ZOFRAN) IV, sodium chloride flush  Micro Results Recent Results (from the past 240 hour(s))  MRSA PCR Screening     Status: None   Collection Time: 02/16/16  9:22 PM  Result Value Ref Range Status   MRSA by PCR NEGATIVE NEGATIVE Final    Comment:        The GeneXpert MRSA Assay (FDA approved for NASAL specimens only), is one component of a comprehensive MRSA colonization surveillance program. It is not intended to diagnose MRSA infection nor to guide or monitor treatment for MRSA infections.     Radiology Reports Dg Chest 2 View  02/16/2016  CLINICAL DATA:  Cough and shortness of breath. Decreased left-sided breath sounds. EXAM: CHEST  2 VIEW COMPARISON:  09/10/2015 and 01/22/2015 and chest CT dated 09/08/2015 FINDINGS: Heart size and pulmonary vascularity are normal. The patient has a severe thoracolumbar scoliosis with chronic compressive atelectasis at the right lung base medially. No infiltrates or effusions.  No acute bone abnormality. IMPRESSION: No active cardiopulmonary disease. Electronically Signed   By: Francene Boyers  M.D.   On: 02/16/2016 12:24   Ct Angio Chest Pe W/cm &/or Wo Cm  02/16/2016  CLINICAL DATA:  Hypoxia EXAM: CT ANGIOGRAPHY CHEST WITH CONTRAST TECHNIQUE: Multidetector CT imaging of the chest was performed using the standard protocol during bolus administration of intravenous contrast. Multiplanar CT image reconstructions and MIPs were obtained to evaluate the vascular anatomy. CONTRAST:  100 mL Isovue 370 IV COMPARISON:  Chest x-ray 02/16/2016.  CT chest 09/08/2015 FINDINGS: Negative for pulmonary embolism. Normal aortic arch. Mild cardiac enlargement. Coronary calcification in the left coronary artery. Marked scoliosis in the thoracic spine. Mild atelectasis in lung bases, improved from 09/08/2015. Negative for pneumonia. No pleural effusion. Negative for mass or adenopathy. Upper abdomen negative Review of the MIP images confirms the above findings. IMPRESSION: Negative for pulmonary embolism Mild bibasilar atelectasis. Moderate scoliosis. Electronically Signed   By: Marlan Palau M.D.   On: 02/16/2016  14:20   Dg Abd Portable 1v  02/16/2016  CLINICAL DATA:  Abdominal distention tonight.  Nausea. EXAM: PORTABLE ABDOMEN - 1 VIEW COMPARISON:  None. FINDINGS: The abdominal gas pattern is negative for obstruction or perforation. There is radiopaque material in the low pelvic midline. This could represent a bladder calculus but it may also represent radiopaque material within bowel. No ureteral calculi are evident. IMPRESSION: Question bladder calculus versus radiopaque material within bowel in the low pelvic midline. Negative for bowel obstruction or perforation. Electronically Signed   By: Ellery Plunkaniel R Mitchell M.D.   On: 02/16/2016 23:01    Time Spent in minutes   45min   Kahliyah Dick M.D on 02/17/2016 at 11:29 AM  Between 7am to 7pm - Pager - [phone]  After 7pm go to [email or web address] - password [email or web address]  [facility] -  Office  [phone]

## 2016-02-17 NOTE — Progress Notes (Signed)
Critical ABG results called to CCM

## 2016-02-17 NOTE — Consult Note (Signed)
PULMONARY / CRITICAL CARE MEDICINE   Name: Shane Hinton MRN: [id number] DOB: 18-Dec-1960    ADMISSION DATE:  02/16/2016 CONSULTATION DATE:  02/17/2016  REFERRING MD:  [facility]  CHIEF COMPLAINT:  Shortness of the breath  HISTORY OF PRESENT ILLNESS:   55 yo male presented to ER with dyspnea and leg swelling x 2 days.  He had run out of his BP meds.  He was hypoxic in ER, and ABG showed acute on chronic hypercapnia.  He was started on BiPAP and admitted to SDU by hospitalist.  He had persistent hypercapnia, and PCCM asked to assess respiratory failure.  He had admission in November 2016 for the same and required intubation at that time.  He had sleep study 01/04/16 which revealed OSA (AHI 14.1/hr) and was started on CPAP.   SUBJECTIVE:   Not tolerating BiPAP well. ABG not much improved. Patient wants to eat and drink.   VITAL SIGNS: BP 141/72 mmHg  Pulse 85  Temp(Src) 98.3 F (36.8 C) (Axillary)  Resp 24  Ht  (1.727 m)  Wt 130 kg (286 lb 9.6 oz)  BMI 43.59 kg/m2  SpO2 97%  HEMODYNAMICS:    VENTILATOR SETTINGS: Vent Mode:  [-] BIPAP FiO2 (%):  [30 %-60 %] 40 % Set Rate:  [12 bmp] 12 bmp PEEP:  [5 cmH20] 5 cmH20  INTAKE / OUTPUT: I/O last 3 completed shifts: In: -  Out: 980 [Urine:980]  PHYSICAL EXAMINATION: General: Adult AA male, resting in bed, in NAD. Neuro: A&O x 3, non-focal.  HEENT: [place]/AT. PERRL, sclerae anicteric. BiPAP in place. Cardiovascular: RRR, no M/R/G.  Lungs: Respirations shallow and unlabored.  CTA bilaterally, No W/R/R. Abdomen: BS x 4, soft, NT/ND.  Musculoskeletal: No gross deformities, no edema.  Skin: Intact, warm, no rashes.  LABS:  BMET  Recent Labs Lab 02/16/16 1225 02/17/16 0527  NA 138 143  K 3.5 3.9  CL 97* 94*  CO2 39* 43*  BUN 27* 17  CREATININE 1.14 1.17  GLUCOSE 218* 172*    Electrolytes  Recent Labs Lab 02/16/16 1225 02/17/16 0527  CALCIUM 8.6* 8.6*    CBC  Recent Labs Lab 02/16/16 1225  WBC 6.4   HGB 15.3  HCT 48.1  PLT 243    Coag's No results for input(s): APTT, INR in the last 168 hours.  Sepsis Markers No results for input(s): LATICACIDVEN, PROCALCITON, O2SATVEN in the last 168 hours.  ABG  Recent Labs Lab 02/16/16 2201 02/17/16 0327 02/17/16 0850  PHART 7.305* 7.253* 7.277*  PCO2ART 88.0* 99.3* 99.2*  PO2ART 82.5 99.7 56.1*    Liver Enzymes No results for input(s): AST, ALT, ALKPHOS, BILITOT, ALBUMIN in the last 168 hours.  Cardiac Enzymes  Recent Labs Lab 02/16/16 1225 02/16/16 2208 02/17/16 0527  TROPONINI 0.03 <0.03 <0.03    Glucose  Recent Labs Lab 02/17/16 0051 02/17/16 0458 02/17/16 0755  GLUCAP 142* 140* 125*    Imaging Dg Chest 2 View  02/16/2016  CLINICAL DATA:  Cough and shortness of breath. Decreased left-sided breath sounds. EXAM: CHEST  2 VIEW COMPARISON:  09/10/2015 and 01/22/2015 and chest CT dated 09/08/2015 FINDINGS: Heart size and pulmonary vascularity are normal. The patient has a severe thoracolumbar scoliosis with chronic compressive atelectasis at the right lung base medially. No infiltrates or effusions.  No acute bone abnormality. IMPRESSION: No active cardiopulmonary disease. Electronically Signed   By: Francene Boyers M.D.   On: 02/16/2016 12:24   Ct Angio Chest Pe W/cm &/or Wo Cm  02/16/2016  CLINICAL DATA:  Hypoxia EXAM: CT ANGIOGRAPHY CHEST WITH CONTRAST TECHNIQUE: Multidetector CT imaging of the chest was performed using the standard protocol during bolus administration of intravenous contrast. Multiplanar CT image reconstructions and MIPs were obtained to evaluate the vascular anatomy. CONTRAST:  100 mL Isovue 370 IV COMPARISON:  Chest x-ray 02/16/2016.  CT chest 09/08/2015 FINDINGS: Negative for pulmonary embolism. Normal aortic arch. Mild cardiac enlargement. Coronary calcification in the left coronary artery. Marked scoliosis in the thoracic spine. Mild atelectasis in lung bases, improved from 09/08/2015. Negative for  pneumonia. No pleural effusion. Negative for mass or adenopathy. Upper abdomen negative Review of the MIP images confirms the above findings. IMPRESSION: Negative for pulmonary embolism Mild bibasilar atelectasis. Moderate scoliosis. Electronically Signed   By: Marlan Palauharles  Clark M.D.   On: 02/16/2016 14:20   Dg Abd Portable 1v  02/16/2016  CLINICAL DATA:  Abdominal distention tonight.  Nausea. EXAM: PORTABLE ABDOMEN - 1 VIEW COMPARISON:  None. FINDINGS: The abdominal gas pattern is negative for obstruction or perforation. There is radiopaque material in the low pelvic midline. This could represent a bladder calculus but it may also represent radiopaque material within bowel. No ureteral calculi are evident. IMPRESSION: Question bladder calculus versus radiopaque material within bowel in the low pelvic midline. Negative for bowel obstruction or perforation. Electronically Signed   By: Ellery Plunkaniel R Mitchell M.D.   On: 02/16/2016 23:01     STUDIES:  09/09/15 Echo >> mild LVH, EF 65 to 70%, grade 1 diastolic CHF 12/31/15 PSG >> AHI 14.1, SpO2 low 59% 02/16/16 CT angio chest >> no PE, marked scoliosis, coronary calcification  CULTURES:  ANTIBIOTICS:  SIGNIFICANT EVENTS: 4/10 Admit  LINES/TUBES:  DISCUSSION: 55 yo male with acute on chronic hypoxic/hypercapnic respiratory failure in setting of morbid obesity, OSA, and severe scoliosis. Has been on BiPAP overnight with not much ABG improvement. BiPAP settings reviewed and adjusted 4/11 AM with plans for ABG later, if not improved/worsening may need intubation.   ASSESSMENT / PLAN:  Acute on chronic hypoxic/hypercapnic respiratory failure. Decompensated OSA - on home CPAP (recently prescribed). -Continue BiPAP for now in hopes of avoiding intubation (settings reviewed/adjsuted) -Repeat ABG in one hour -Oxygen to keep SpO2 88 to 95%. -Diuresis as below -CXR intermittently. -Avoid sedating medications -Oral care  Acute on chronic diastolic  CHF Hx of HTN, HLD -Echo pending -Continue diuresis per primary -Continue ASA, lipitor, lasix, lisinopril, lopressor  Joneen RoachPaul Hoffman, AGACNP-BC [facility] Pager [phone] or [phone]  02/17/2016 11:55 AM  Attending Note:  55 year old male with acute on chronic hypercarbic and hypoxemic respiratory failure presenting to PCCM with pH of 7.25 and CO2 of 99.  The patient is clearly a chronic retainer.  On exam, the BiPAP is not on properly and patient is taking very little volume.  Distant and decreased BS on exam as well.  Adjusted vent to where patient is taking approximately 250-300 ml/breath and will repeat ABG.  In the meantime, continue diureses as ordered and intermittent CXR.  Completely avoid all sedating medications.  Hopefully can avoid intubation as I have little doubt that if this patient is intubated that he is more likely than not will end up needing a tracheostomy due to the severity of his respiratory failure.  The patient is critically ill with multiple organ systems failure and requires high complexity decision making for assessment and support, frequent evaluation and titration of therapies, application of advanced monitoring technologies and extensive interpretation of multiple databases.   Critical Care  Time devoted to patient care services described in this note is  35  Minutes. This time reflects time of care of this signee Dr Jennet Maduro. This critical care time does not reflect procedure time, or teaching time or supervisory time of PA/NP/Med student/Med Resident etc but could involve care discussion time.  Rush Farmer, M.D. [facility] Pulmonary/Critical Care Medicine. Pager: [phone]. After hours pager: [phone].

## 2016-02-17 NOTE — Progress Notes (Signed)
Report of Abg called to Dr. Allegra Granaamaschandran. No new orders. Patient remains on BIPAP.

## 2016-02-17 NOTE — Consult Note (Signed)
PULMONARY / CRITICAL CARE MEDICINE   Name: Shane Hinton MRN: [id number] DOB: [birth year]    ADMISSION DATE:  02/16/2016 CONSULTATION DATE:  02/17/2016  REFERRING MD:  [facility]  CHIEF COMPLAINT:  Shortness of the breath  HISTORY OF PRESENT ILLNESS:   55 yo male presented to ER with dyspnea and leg swelling x 2 days.  He had run out of his BP meds.  He was hypoxic in ER, and ABG showed acute on chronic hypercapnia.  He was started on BiPAP and admitted to SDU by hospitalist.  He had persistent hypercapnia, and PCCM asked to assess respiratory failure.  He had admission in November 2016 for the same and required intubation at that time.  He had sleep study 01/04/16 which revealed OSA (AHI 14.1/hr) and was started on CPAP.  PAST MEDICAL HISTORY :  He  has a past medical history of Hypertension; Diabetes mellitus (HCC) (09/23/2015); Hyperlipidemia; Chronic diastolic (congestive) heart failure (HCC) (11/25/2015); Influenza with pneumonia (2016); OSA (obstructive sleep apnea); Scoliosis; and Chronic respiratory failure (HCC).  PAST SURGICAL HISTORY: He  has past surgical history that includes No previous surgery.  No Known Allergies  No current facility-administered medications on file prior to encounter.   Current Outpatient Prescriptions on File Prior to Encounter  Medication Sig  . aspirin EC 81 MG tablet Take 1 tablet (81 mg total) by mouth daily.  Marland Kitchen atorvastatin (LIPITOR) 40 MG tablet Take 1 tablet (40 mg total) by mouth daily.  . furosemide (LASIX) 40 MG tablet Take 1 tablet (40 mg total) by mouth daily.  Marland Kitchen gabapentin (NEURONTIN) 300 MG capsule Take 1 capsule (300 mg total) by mouth 2 (two) times daily.  Marland Kitchen lisinopril (PRINIVIL,ZESTRIL) 40 MG tablet Take 40 mg by mouth daily.  . metFORMIN (GLUCOPHAGE) 500 MG tablet Take 1 tablet (500 mg total) by mouth 2 (two) times daily with a meal.  . metoprolol (LOPRESSOR) 50 MG tablet Take 1 tablet (50 mg total) by mouth 2 (two) times daily.  .  nitroGLYCERIN (NITROSTAT) 0.4 MG SL tablet Place 1 tablet (0.4 mg total) under the tongue every 5 (five) minutes as needed for chest pain.  . potassium chloride 20 MEQ TBCR Take 20 mEq by mouth daily as needed (if you take Furosemide (Lasix)).  . TRUEPLUS LANCETS 28G MISC 1 each by Does not apply route daily.  . Blood Glucose Monitoring Suppl (TRUE METRIX METER) DEVI 1 each by Does not apply route daily.  Marland Kitchen glucose blood (TRUE METRIX BLOOD GLUCOSE TEST) test strip Use as instructed    FAMILY HISTORY:  His indicated that his mother is deceased. He indicated that his father is deceased.   SOCIAL HISTORY: He  reports that he has never smoked. He does not have any smokeless tobacco history on file. He reports that he does not drink alcohol or use illicit drugs.  REVIEW OF SYSTEMS:   All negative; except for those that are bolded, which indicate positives.  Constitutional: weight loss, weight gain, night sweats, fevers, chills, fatigue, weakness.  HEENT: headaches, sore throat, sneezing, nasal congestion, post nasal drip, difficulty swallowing, tooth/dental problems, visual complaints, visual changes, ear aches. Neuro: difficulty with speech, weakness, numbness, ataxia. CV:  chest pain, orthopnea, PND, swelling in lower extremities, dizziness, palpitations, syncope.  Resp: cough, hemoptysis, dyspnea, wheezing. GI  heartburn, indigestion, abdominal pain, nausea, vomiting, diarrhea, constipation, change in bowel habits, loss of appetite, hematemesis, melena, hematochezia.  GU: dysuria, change in color of urine, urgency or frequency, flank pain, hematuria. MSK: joint  pain or swelling, decreased range of motion. Psych: change in mood or affect, depression, anxiety, suicidal ideations, homicidal ideations. Skin: rash, itching, bruising.   SUBJECTIVE:   Denies SOB, chest pain.  Asking to eat, states hasn't eaten since yesterday.  VITAL SIGNS: BP 125/84 mmHg  Pulse 77  Temp(Src) 97.8 F (36.6  C) (Axillary)  Resp 19  Ht  (1.727 m)  Wt 130.4 kg (287 lb 7.7 oz)  BMI 43.72 kg/m2  SpO2 97%  HEMODYNAMICS:    VENTILATOR SETTINGS: Vent Mode:  [-] BIPAP FiO2 (%):  [30 %-60 %] 40 % Set Rate:  [12 bmp] 12 bmp PEEP:  [5 cmH20] 5 cmH20  INTAKE / OUTPUT: I/O last 3 completed shifts: In: -  Out: 680 [Urine:680]  PHYSICAL EXAMINATION: General: Adult AA male, resting in bed, in NAD. Neuro: A&O x 3, non-focal.  HEENT: [place]/AT. PERRL, sclerae anicteric. BiPAP in place. Cardiovascular: RRR, no M/R/G.  Lungs: Respirations shallow and unlabored.  CTA bilaterally, No W/R/R. Abdomen: BS x 4, soft, NT/ND.  Musculoskeletal: No gross deformities, no edema.  Skin: Intact, warm, no rashes.    LABS:  BMET  Recent Labs Lab 02/16/16 1225  NA 138  K 3.5  CL 97*  CO2 39*  BUN 27*  CREATININE 1.14  GLUCOSE 218*    Electrolytes  Recent Labs Lab 02/16/16 1225  CALCIUM 8.6*    CBC  Recent Labs Lab 02/16/16 1225  WBC 6.4  HGB 15.3  HCT 48.1  PLT 243    Coag's No results for input(s): APTT, INR in the last 168 hours.  Sepsis Markers No results for input(s): LATICACIDVEN, PROCALCITON, O2SATVEN in the last 168 hours.  ABG  Recent Labs Lab 02/16/16 1756 02/16/16 2201 02/17/16 0327  PHART 7.274* 7.305* 7.253*  PCO2ART 94.0* 88.0* 99.3*  PO2ART 76.0* 82.5 99.7    Liver Enzymes No results for input(s): AST, ALT, ALKPHOS, BILITOT, ALBUMIN in the last 168 hours.  Cardiac Enzymes  Recent Labs Lab 02/16/16 1225 02/16/16 2208  TROPONINI 0.03 <0.03    Glucose  Recent Labs Lab 02/17/16 0051  GLUCAP 142*    Imaging Dg Chest 2 View  02/16/2016  CLINICAL DATA:  Cough and shortness of breath. Decreased left-sided breath sounds. EXAM: CHEST  2 VIEW COMPARISON:  09/10/2015 and 01/22/2015 and chest CT dated 09/08/2015 FINDINGS: Heart size and pulmonary vascularity are normal. The patient has a severe thoracolumbar scoliosis with chronic compressive  atelectasis at the right lung base medially. No infiltrates or effusions.  No acute bone abnormality. IMPRESSION: No active cardiopulmonary disease. Electronically Signed   By: Francene Boyers M.D.   On: 02/16/2016 12:24   Ct Angio Chest Pe W/cm &/or Wo Cm  02/16/2016  CLINICAL DATA:  Hypoxia EXAM: CT ANGIOGRAPHY CHEST WITH CONTRAST TECHNIQUE: Multidetector CT imaging of the chest was performed using the standard protocol during bolus administration of intravenous contrast. Multiplanar CT image reconstructions and MIPs were obtained to evaluate the vascular anatomy. CONTRAST:  100 mL Isovue 370 IV COMPARISON:  Chest x-ray 02/16/2016.  CT chest 09/08/2015 FINDINGS: Negative for pulmonary embolism. Normal aortic arch. Mild cardiac enlargement. Coronary calcification in the left coronary artery. Marked scoliosis in the thoracic spine. Mild atelectasis in lung bases, improved from 09/08/2015. Negative for pneumonia. No pleural effusion. Negative for mass or adenopathy. Upper abdomen negative Review of the MIP images confirms the above findings. IMPRESSION: Negative for pulmonary embolism Mild bibasilar atelectasis. Moderate scoliosis. Electronically Signed   By: Marlan Palau M.D.  On: 02/16/2016 14:20   Dg Abd Portable 1v  02/16/2016  CLINICAL DATA:  Abdominal distention tonight.  Nausea. EXAM: PORTABLE ABDOMEN - 1 VIEW COMPARISON:  None. FINDINGS: The abdominal gas pattern is negative for obstruction or perforation. There is radiopaque material in the low pelvic midline. This could represent a bladder calculus but it may also represent radiopaque material within bowel. No ureteral calculi are evident. IMPRESSION: Question bladder calculus versus radiopaque material within bowel in the low pelvic midline. Negative for bowel obstruction or perforation. Electronically Signed   By: Ellery Plunkaniel R Mitchell M.D.   On: 02/16/2016 23:01     STUDIES:  09/09/15 Echo >> mild LVH, EF 65 to 70%, grade 1 diastolic  CHF 12/31/15 PSG >> AHI 14.1, SpO2 low 59% 02/16/16 CT angio chest >> no PE, marked scoliosis, coronary calcification  CULTURES:  ANTIBIOTICS:  SIGNIFICANT EVENTS: 4/10 Admit  LINES/TUBES:  DISCUSSION: 55 yo male with acute on chronic hypoxic/hypercapnic respiratory failure in setting of morbid obesity, OSA, and severe scoliosis.  He has hx of HTN, chronic diastolic CHF, HLD, DM, Chronic pain  ASSESSMENT / PLAN:  PULMONARY A: Acute on chronic hypoxic/hypercapnic respiratory failure. OSA - on CPAP. P:   Continue BiPAP for now in hopes of avoiding intubation (despite worsening ABG, pt is A&O x 3 and in NAD). Oxygen to keep SpO2 88 to 95%. CXR intermittently.  CARDIOVASCULAR A:  Acute on chronic diastolic CHF. Hx of HTN, HLD. P:  F/u Echo. Continue ASA, lipitor, lasix, lisinopril, lopressor.  RENAL A:   No acute issues. P:   Monitor renal fx, urine outpt.  GASTROINTESTINAL A:   Morbid obesity. P:   NPO until respiratory status more stable. Will need dietary consult to assess nutrition needs for weight loss.  HEMATOLOGIC A:   No acute issues. P:  F/u CBC. Lovenox for DVT prevention.  INFECTIOUS A:   No evidence for infection. P:   Monitor clinically.  ENDOCRINE A:   DM type II with peripheral neuropathy.   P:   SSI. Hold outpt metformin.  NEUROLOGIC A:   Chronic pain. P:   Avoid benzo's, narcotics. Hold outpt neurotin for now.   CC time: 35 minutes.   Rutherford Guysahul Desai, [place] - [facility] [place] [facility] Pager: [phone]  or [phone] 02/17/2016, 5:13 AM  PCCM Attending Note: Patient seen and examined with physician's assistant. Please refer to his consultation note which I reviewed in detail. 55 year old male with acute on chronic hypercapnic respiratory failure. Patient's dyspnea seems to be improving on noninvasive positive pressure ventilation. Patient denies any chest pain or pressure. Denies any subjective  fever, chills, or sweats either.  BP 142/71 mmHg  Pulse 82  Temp(Src) 98.4 F (36.9 C) (Axillary)  Resp 15  Ht 5\' 8"  (1.727 m)  Wt 286 lb 9.6 oz (130 kg)  BMI 43.59 kg/m2  SpO2 98% Gen.: Laying in bed. Sleeping until awoken. No distress. Pulmonary: Slightly diminished breath sounds in the bases. Normal work of breathing on BiPAP. Neurological: Following commands. Answering questions appropriately. Oriented appropriately.  CBC Latest Ref Rng 02/16/2016 09/12/2015 09/11/2015  WBC 4.0 - 10.5 K/uL 6.4 7.3 8.9  Hemoglobin 13.0 - 17.0 g/dL 29.515.3 62.114.7 30.815.6  Hematocrit 39.0 - 52.0 % 48.1 47.5 48.9  Platelets 150 - 400 K/uL 243 264 295    BMP Latest Ref Rng 02/17/2016 02/16/2016 12/03/2015  Glucose 65 - 99 mg/dL 657(Q172(H) 469(G218(H) 295(M191(H)  BUN 6 - 20 mg/dL 17  27(H) 13  Creatinine 0.61 - 1.24 mg/dL 2.72 5.36 6.44  Sodium 135 - 145 mmol/L 143 138 139  Potassium 3.5 - 5.1 mmol/L 3.9 3.5 4.5  Chloride 101 - 111 mmol/L 94(L) 97(L) 100  CO2 22 - 32 mmol/L 43(H) 39(H) 33(H)  Calcium 8.9 - 10.3 mg/dL 0.3(K) 7.4(Q) 9.5   ABG on FiO2 0.6:  7.25/99/100  CTA CHEST 4/10 (personally reviewed by me): Negative for pulmonary embolism. Scoliosis noted. Mild bilateral basilar atelectasis. No pericardial effusion. No pleural effusion. No pathologic mediastinal adenopathy.  A/P: 55 year old male admitted with increasing lower extremity edema and history of OSA. Patient has evidence of chronic hypercarbic respiratory failure. Suspect worsening of ventilation a function of V/Q mismatch with hyperoxygenation. Patient is awake and responding appropriately. He is requesting to eat.  1. Acute on chronic hypercarbic respiratory failure: Recommend continuing BiPAP support. Recommend adjusting saturation to maintain 88-95% to prevent V/Q mismatching. 2. Acute on chronic diastolic congestive heart failure: Currently on lisinopril, Lopressor, Lasix, Lipitor, & aspirin. Echocardiogram pending. Further management per primary  service. 3. OSA: Currently prescribed CPAP. Continuing on BiPAP support for hypercarbic respiratory failure.  Remainder of care per primary service.  Donna Christen Jamison Neighbor, M.D. [facility] Pulmonary & Critical Care Pager:  [phone] After 3pm or if no response, call [phone] 7:07 AM 02/17/2016

## 2016-02-18 LAB — BLOOD GAS, ARTERIAL
Acid-Base Excess: 17.4 mmol/L — ABNORMAL HIGH (ref 0.0–2.0)
Acid-Base Excess: 21.3 mmol/L — ABNORMAL HIGH (ref 0.0–2.0)
Acid-Base Excess: 22 mmol/L — ABNORMAL HIGH (ref 0.0–2.0)
BICARBONATE: 48.3 meq/L — AB (ref 20.0–24.0)
BICARBONATE: 48.7 meq/L — AB (ref 20.0–24.0)
Bicarbonate: 44.7 mEq/L — ABNORMAL HIGH (ref 20.0–24.0)
DELIVERY SYSTEMS: POSITIVE
Drawn by: 448981
Drawn by: 448981
Drawn by: 24610
Expiratory PAP: 5
Expiratory PAP: 5
FIO2: 0.4
FIO2: 0.4
INSPIRATORY PAP: 20
Inspiratory PAP: 20
O2 CONTENT: 6 L/min
O2 SAT: 93.3 %
O2 Saturation: 83.1 %
O2 Saturation: 67.5 %
PATIENT TEMPERATURE: 98.6
PATIENT TEMPERATURE: 98.6
PH ART: 7.369 (ref 7.350–7.450)
PO2 ART: 40.6 mmHg — AB (ref 80.0–100.0)
Patient temperature: 98.6
TCO2: 47.8 mmol/L (ref 0–100)
TCO2: 51.3 mmol/L (ref 0–100)
TCO2: 51.4 mmol/L (ref 0–100)
pCO2 arterial: 99.2 mmHg (ref 35.0–45.0)
pCO2 arterial: 95.4 mmHg (ref 35.0–45.0)
pCO2 arterial: 86.5 mmHg (ref 35.0–45.0)
pH, Arterial: 7.277 — ABNORMAL LOW (ref 7.350–7.450)
pH, Arterial: 7.325 — ABNORMAL LOW (ref 7.350–7.450)
pO2, Arterial: 56.1 mmHg — ABNORMAL LOW (ref 80.0–100.0)
pO2, Arterial: 74.4 mmHg — ABNORMAL LOW (ref 80.0–100.0)

## 2016-02-18 LAB — GLUCOSE, CAPILLARY
GLUCOSE-CAPILLARY: 103 mg/dL — AB (ref 65–99)
GLUCOSE-CAPILLARY: 187 mg/dL — AB (ref 65–99)
GLUCOSE-CAPILLARY: 185 mg/dL — AB (ref 65–99)
GLUCOSE-CAPILLARY: 370 mg/dL — AB (ref 65–99)
Glucose-Capillary: 164 mg/dL — ABNORMAL HIGH (ref 65–99)
Glucose-Capillary: 81 mg/dL (ref 65–99)

## 2016-02-18 LAB — CBC
HEMATOCRIT: 51.3 % (ref 39.0–52.0)
HEMOGLOBIN: 15.9 g/dL (ref 13.0–17.0)
MCH: 30.5 pg (ref 26.0–34.0)
MCHC: 31 g/dL (ref 30.0–36.0)
MCV: 98.5 fL (ref 78.0–100.0)
Platelets: 213 10*3/uL (ref 150–400)
RBC: 5.21 MIL/uL (ref 4.22–5.81)
RDW: 14.9 % (ref 11.5–15.5)
WBC: 6 10*3/uL (ref 4.0–10.5)

## 2016-02-18 LAB — BASIC METABOLIC PANEL
ANION GAP: 11 (ref 5–15)
BUN: 12 mg/dL (ref 6–20)
CHLORIDE: 90 mmol/L — AB (ref 101–111)
CO2: 42 mmol/L — ABNORMAL HIGH (ref 22–32)
Calcium: 8.5 mg/dL — ABNORMAL LOW (ref 8.9–10.3)
Creatinine, Ser: 1.03 mg/dL (ref 0.61–1.24)
GFR calc Af Amer: >60 mL/min (ref >60)
GFR calc non Af Amer: >60 mL/min (ref >60)
GLUCOSE: 65 mg/dL (ref 65–99)
POTASSIUM: 4.3 mmol/L (ref 3.5–5.1)
Sodium: 143 mmol/L (ref 135–145)

## 2016-02-18 LAB — HEMOGLOBIN A1C
HEMOGLOBIN A1C: 8.9 % — AB (ref 4.8–5.6)
Mean Plasma Glucose: 209 mg/dL

## 2016-02-18 MED ORDER — FUROSEMIDE 10 MG/ML IJ SOLN
40.0000 mg | Freq: Two times a day (BID) | INTRAMUSCULAR | Status: DC
  Administered 2016-02-18 – 2016-02-19 (×2): 40 mg via INTRAVENOUS
  Filled 2016-02-18 (×2): qty 4

## 2016-02-18 MED ORDER — METOPROLOL TARTRATE 50 MG PO TABS
50.0000 mg | ORAL_TABLET | Freq: Two times a day (BID) | ORAL | Status: DC
Start: 2016-02-18 — End: 2016-02-21
  Administered 2016-02-18 – 2016-02-21 (×7): 50 mg via ORAL
  Filled 2016-02-18 (×7): qty 1

## 2016-02-18 NOTE — Evaluation (Addendum)
Physical Therapy Evaluation Patient Details Name: Shane FallenKenneth Pricer MRN: [id number] DOB: 08/05/1961 Today's Date: 02/18/2016   History of Present Illness  55 yo male with DM/HTN/CHF, obesity, OSA/OHS presented to ER with dyspnea and leg swelling x 2 days. He had run out of his BP meds and lasix. He was hypoxic in ER, and ABG showed acute on chronic hypercapnia. He was started on BiPAP and admitted to SDU. Clinically felt to be volume overloaded and diuresing.     Clinical Impression  Pt admitted with above diagnosis. Pt currently with functional limitations due to the deficits listed below (see PT Problem List). Pt has very little activity tolerance due to SOB, but was able to ambulate short distances with a RW and ascend/descend 4 steps with MinGuard for safety. Pt's SpO2 92-100% on 6L [place]. Pt will benefit from skilled PT to increase their independence and safety with mobility to allow discharge to the venue listed below. Pt would benefit from follow up PT services after d/c but pt's insurance will not cover. Pt will benefit from a rollator and tub bench to decrease fall risk at home.      Follow Up Recommendations No PT follow up;Supervision/Assistance - 24 hour    Equipment Recommendations  Other (comment) (Rollator and Tub bench)    Recommendations for Other Services       Precautions / Restrictions Precautions Precautions: Fall Restrictions Weight Bearing Restrictions: No      Mobility  Bed Mobility               General bed mobility comments: pt in chair upon entry to room  Transfers Overall transfer level: Modified independent Equipment used: Rolling walker (2 wheeled)             General transfer comment: Independent with use of RW.   Ambulation/Gait Ambulation/Gait assistance: Min guard Ambulation Distance (Feet): 50 Feet (15+35 with sitting rest break. ) Assistive device: Rolling walker (2 wheeled) Gait Pattern/deviations: Step-through  pattern;Decreased stride length;Decreased dorsiflexion - right;Decreased dorsiflexion - left;Wide base of support Gait velocity: very slow Gait velocity interpretation: Below normal speed for age/gender General Gait Details: Pt has very slow steady gait with RW. Decreased dorsiflexion bilaterally.   Stairs  Pt completed 4 steps using L rail with step to pattern going forwards with PraxairMin Guard for safety.           Wheelchair Mobility    Modified Rankin (Stroke Patients Only)       Balance Overall balance assessment: Needs assistance Sitting-balance support: No upper extremity supported;Feet supported Sitting balance-Leahy Scale: Good     Standing balance support: Bilateral upper extremity supported Standing balance-Leahy Scale: Fair Standing balance comment: reliant on UE support                             Pertinent Vitals/Pain Pain Assessment: No/denies pain. Vitals stable on 6L .     Home Living Family/patient expects to be discharged to:: Private residence Living Arrangements: Other relatives (niece) Available Help at Discharge: Family;Available 24 hours/day Type of Home: Apartment Home Access: Stairs to enter Entrance Stairs-Rails: Left Entrance Stairs-Number of Steps: 4 Home Layout: One level Home Equipment: None      Prior Function Level of Independence: Independent               Hand Dominance        Extremity/Trunk Assessment   Upper Extremity Assessment: Overall WFL for tasks assessed  Lower Extremity Assessment: Generalized weakness      Cervical / Trunk Assessment: Normal  Communication   Communication: No difficulties  Cognition Arousal/Alertness: Awake/alert Behavior During Therapy: WFL for tasks assessed/performed Overall Cognitive Status: Within Functional Limits for tasks assessed                      General Comments General comments (skin integrity, edema, etc.): Pt very pleasant and  cooperative with therapy.     Exercises        Assessment/Plan    PT Assessment Patient needs continued PT services  PT Diagnosis Abnormality of gait;Difficulty walking;Generalized weakness   PT Problem List Decreased strength;Decreased activity tolerance;Decreased balance;Decreased knowledge of use of DME;Cardiopulmonary status limiting activity;Obesity  PT Treatment Interventions Gait training;DME instruction;Stair training;Functional mobility training;Therapeutic activities;Therapeutic exercise;Balance training;Patient/family education   PT Goals (Current goals can be found in the Care Plan section) Acute Rehab PT Goals Patient Stated Goal: to go home PT Goal Formulation: With patient Time For Goal Achievement: 03/03/16 Potential to Achieve Goals: Good    Frequency Min 3X/week   Barriers to discharge        Co-evaluation               End of Session Equipment Utilized During Treatment: Gait belt;Oxygen Activity Tolerance: Patient tolerated treatment well Patient left: in chair;with call bell/phone within reach;with chair alarm set Nurse Communication: Mobility status         Time: 1610-9604 PT Time Calculation (min) (ACUTE ONLY): 26 min   Charges:   PT Evaluation $PT Eval Moderate Complexity: 1 Procedure PT Treatments $Gait Training: 8-22 mins   PT G Codes:        Kathrynn Speed 02/18/2016, 1:51 PM

## 2016-02-18 NOTE — Progress Notes (Signed)
T [facility] Progress Note                                            Patient Demographics:    Shane Hinton, is a 55 y.o. male, DOB - 01/14/1961, ZOX:[id number]  Admit date - 02/16/2016   Admitting Physician Therisa DoyneAnastassia Doutova, MD  Outpatient Primary MD for the patient is [facility]  55 yo male with DM/HTN/CHF, obesity, OSA/OHS  presented to ER with dyspnea and leg swelling x 2 days. He had run out of his BP meds and lasix. He was hypoxic in ER, and ABG showed acute on chronic hypercapnia. He was started on BiPAP and admitted to SDU. Clinically felt to be volume overloaded and diuresing. Remains acidotic and hypercarbic-but mentation stable, PCCM consulting    Subjective:  Breathing improving, feels better since BIPAP off this am   Assessment  & Plan :     Acute respiratory failure with hypoxia and hypercapnia (HCC) -due to Diastolic CHF and OSA/OHS -had on BIPAP with acute resp acidosis-till this am, mentation stable, Ph better, still very hyperbarbic but improved from admission -diuresing well, negative 3.7L -PCCM consulting, appreciate input -continue IV lasix, will cut down dose today -FU ECHO -Needs CPAP at home-see below   Acute on chronic diastolic CHF -as above, FU ECHO   Essential (primary) hypertension -stable, on lisinopril, metoprolol and lasix    Diabetes mellitus (HCC) -held metformin, SSI   OSA/OHS -s/p sleep study in 2/17 but never got CPAP machine, asked CM to make arrangements for pt to get CPAP machine  DVT proph: lovenox  Code Status : Full Code  Family Communication  : no family at bedside  Disposition Plan  : keep in SDU  Consults  :  PCCM  Procedures  : BIPAP  Lab Results  Component Value Date   PLT 213 02/18/2016    Anti-infectives    None        Objective:   Filed Vitals:   02/18/16 0400  02/18/16 0442 02/18/16 0700 02/18/16 0801  BP: 127/82  142/89 142/97  Pulse: 75  85 84  Temp: 98.4 F (36.9 C)  98.8 F (37.1 C)   TempSrc: Oral  Oral   Resp: 20  19 26   Height:      Weight:  127.5 kg (281 lb 1.4 oz)    SpO2: 97%  97% 96%    Wt Readings from Last 3 Encounters:  02/18/16 127.5 kg (281 lb 1.4 oz)  12/31/15 127.007 kg (280 lb)  12/05/15 127.007 kg (280 lb)     Intake/Output Summary (Last 24 hours) at 02/18/16 1051 Last data filed at 02/18/16 0935  Gross per 24 hour  Intake    363 ml  Output   2740 ml  Net  -2377 ml     Physical Exam  Awake Alert, Oriented X 3, on BIPAP HEENT: Supple Neck,No JVD, No cervical lymphadenopathy appreciated.  Lungs: diminished BS at bases CVS: RRR,No Gallops,Rubs or new Murmurs, No Parasternal Heave Abd: +ve B.Sounds, Abd Soft, No tenderness, No organomegaly appriciated, No rebound - guarding or rigidity. Ext: 1plus edema    Data Review:    CBC  Recent Labs Lab 02/16/16 1225 02/18/16 0249  WBC 6.4 6.0  HGB 15.3 15.9  HCT 48.1 51.3  PLT 243 213  MCV 94.5 98.5  MCH 30.1 30.5  MCHC 31.8 31.0  RDW 15.0 14.9  LYMPHSABS 1.7  --   MONOABS 0.7  --   EOSABS 0.1  --   BASOSABS 0.0  --     Chemistries   Recent Labs Lab 02/16/16 1225 02/17/16 0527 02/18/16 0249  NA 138 143 143  K 3.5 3.9 4.3  CL 97* 94* 90*  CO2 39* 43* 42*  GLUCOSE 218* 172* 65  BUN 27* 17 12  CREATININE 1.14 1.17 1.03  CALCIUM 8.6* 8.6* 8.5*   ------------------------------------------------------------------------------------------------------------------ No results for input(s): CHOL, HDL, LDLCALC, TRIG, CHOLHDL, LDLDIRECT in the last 72 hours.  Lab Results  Component Value Date   HGBA1C 8.9* 02/17/2016   ------------------------------------------------------------------------------------------------------------------ No results for input(s): TSH, T4TOTAL, T3FREE, THYROIDAB in the last 72 hours.  Invalid input(s):  FREET3 ------------------------------------------------------------------------------------------------------------------ No results for input(s): VITAMINB12, FOLATE, FERRITIN, TIBC, IRON, RETICCTPCT in the last 72 hours.  Coagulation profile No results for input(s): INR, PROTIME in the last 168 hours.  No results for input(s): DDIMER in the last 72 hours.  Cardiac Enzymes  Recent Labs Lab 02/16/16 2208 02/17/16 0527 02/17/16 0945  TROPONINI <0.03 <0.03 <0.03   ------------------------------------------------------------------------------------------------------------------    Component Value Date/Time   BNP 310.6* 02/16/2016 1225    Inpatient Medications  Scheduled Meds: . antiseptic oral rinse  7 mL Mouth Rinse q12n4p  . aspirin EC  81 mg Oral Daily  . atorvastatin  40 mg Oral Daily  . chlorhexidine  15 mL Mouth Rinse BID  . enoxaparin (LOVENOX) injection  40 mg Subcutaneous Q24H  . furosemide  40 mg Intravenous Q8H  . insulin aspart  0-20 Units Subcutaneous 6 times per day  . lisinopril  40 mg Oral Daily  . metoprolol  5 mg Intravenous 4 times per day  . sodium chloride flush  3 mL Intravenous Q12H   Continuous Infusions:  PRN Meds:.sodium chloride, acetaminophen, ondansetron (ZOFRAN) IV, sodium chloride flush  Micro Results Recent Results (from the past 240 hour(s))  MRSA PCR Screening     Status: None   Collection Time: 02/16/16  9:22 PM  Result Value Ref Range Status   MRSA by PCR NEGATIVE NEGATIVE Final    Comment:        The GeneXpert MRSA Assay (FDA approved for NASAL specimens only), is one component of a comprehensive MRSA colonization surveillance program. It is not intended to diagnose MRSA infection nor to guide or monitor treatment for MRSA infections.     Radiology Reports Dg Chest 2 View  02/16/2016  CLINICAL DATA:  Cough and shortness of breath. Decreased left-sided breath sounds. EXAM: CHEST  2 VIEW COMPARISON:  09/10/2015 and  01/22/2015 and chest CT dated 09/08/2015 FINDINGS: Heart size and pulmonary vascularity are normal. The patient has a severe thoracolumbar scoliosis with chronic compressive atelectasis at the right lung base medially. No infiltrates or effusions.  No acute bone abnormality. IMPRESSION: No active cardiopulmonary disease. Electronically Signed   By: Francene Boyers M.D.   On: 02/16/2016 12:24   Ct Angio Chest Pe W/cm &/or Wo Cm  02/16/2016  CLINICAL DATA:  Hypoxia EXAM: CT ANGIOGRAPHY CHEST WITH CONTRAST TECHNIQUE: Multidetector CT imaging of the chest was performed using the standard protocol during bolus administration of intravenous contrast. Multiplanar CT image reconstructions and MIPs were obtained to evaluate the vascular anatomy. CONTRAST:  100 mL Isovue 370 IV COMPARISON:  Chest x-ray 02/16/2016.  CT chest 09/08/2015 FINDINGS: Negative for pulmonary embolism. Normal aortic arch. Mild cardiac enlargement. Coronary calcification in the left  coronary artery. Marked scoliosis in the thoracic spine. Mild atelectasis in lung bases, improved from 09/08/2015. Negative for pneumonia. No pleural effusion. Negative for mass or adenopathy. Upper abdomen negative Review of the MIP images confirms the above findings. IMPRESSION: Negative for pulmonary embolism Mild bibasilar atelectasis. Moderate scoliosis. Electronically Signed   By: Marlan Palau M.D.   On: 02/16/2016 14:20   Dg Abd Portable 1v  02/16/2016  CLINICAL DATA:  Abdominal distention tonight.  Nausea. EXAM: PORTABLE ABDOMEN - 1 VIEW COMPARISON:  None. FINDINGS: The abdominal gas pattern is negative for obstruction or perforation. There is radiopaque material in the low pelvic midline. This could represent a bladder calculus but it may also represent radiopaque material within bowel. No ureteral calculi are evident. IMPRESSION: Question bladder calculus versus radiopaque material within bowel in the low pelvic midline. Negative for bowel obstruction or  perforation. Electronically Signed   By: Ellery Plunk M.D.   On: 02/16/2016 23:01    Time Spent in minutes     Valton Schwartz M.D on 02/18/2016 at 10:51 AM  Between 7am to 7pm - Pager - [phone]  After 7pm go to [email or web address] - password [facility]  [facility] -  Office  [phone]

## 2016-02-18 NOTE — Progress Notes (Signed)
At time of ABG, pt is sitting up in chair, talking, appropriate.  Requesting food and making jokes.  No distress noted, VSS.

## 2016-02-18 NOTE — Care Management Note (Addendum)
Case Management Note  Patient Details  Name: Shane Hinton MRN: [id number] Date of Birth: 10/16/1961  Subjective/Objective:     Pt admitted with SOB               Action/Plan:   Attending requested CM to arrange CPAP for pt to take home, pt had sleep study 2/17.  [facility] - [facility]  Pt is independent from home with roomates; states he has support system.  Pt is active with [facility] for Home O2 - 2L.  Pt educated on the need for low sodium diet and daily weights, pt accepted teaching and stated "I know I have to get a scale" - pt denied hardship with obtaining scale.  Pt stated he feels he need DME to assist with mobilization - CM requested PT consult from MD.  Pt states he has been active with medicaid since Dec 16 - has PCP with [facility] and [facility]  CM made HF nurse referral   Expected Discharge Date:                  Expected Discharge Plan:  Home/Self Care  In-House Referral:     Discharge planning Services  CM Consult  Post Acute Care Choice:    Choice offered to:     DME Arranged:    DME Agency:     HH Arranged:    HH Agency:     Status of Service:  In process, will continue to follow  Medicare Important Message Given:    Date Medicare IM Given:    Medicare IM give by:    Date Additional Medicare IM Given:    Additional Medicare Important Message give by:     If discussed at Long Length of Stay Meetings, dates discussed:    Additional Comments:  Cherylann ParrClaxton, Anastasio Wogan S, RN 02/18/2016, 9:54 AM

## 2016-02-18 NOTE — Progress Notes (Signed)
PULMONARY / CRITICAL CARE MEDICINE   Name: Shane FallenKenneth Suddeth MRN: [id number] DOB: 03/17/1961    ADMISSION DATE:  02/16/2016 CONSULTATION DATE:  02/17/2016  [facility]ERRING MD:  [facility]  CHIEF COMPLAINT:  Shortness of the breath  HISTORY OF PRESENT ILLNESS:   55 yo male presented to ER with dyspnea and leg swelling x 2 days.  He had run out of his BP meds.  He was hypoxic in ER, and ABG showed acute on chronic hypercapnia.  He was started on BiPAP and admitted to SDU by hospitalist.  He had persistent hypercapnia, and PCCM asked to assess respiratory failure.  He had admission in November 2016 for the same and required intubation at that time.  He had sleep study 01/04/16 which revealed OSA (AHI 14.1/hr) and was started on CPAP.   SUBJECTIVE:   Up in a chair, talking on the phone.  VITAL SIGNS: BP 142/97 mmHg  Pulse 84  Temp(Src) 98.8 F (37.1 C) (Oral)  Resp 26  Ht 5\' 8"  (1.727 m)  Wt 127.5 kg (281 lb 1.4 oz)  BMI 42.75 kg/m2  SpO2 96%  HEMODYNAMICS:    VENTILATOR SETTINGS: Vent Mode:  [-] BIPAP FiO2 (%):  [40 %] 40 % Set Rate:  [20 bmp] 20 bmp PEEP:  [5 cmH20] 5 cmH20  INTAKE / OUTPUT: I/O last 3 completed shifts: In: -  Out: 3040 [Urine:3040]  PHYSICAL EXAMINATION: General: Adult AA male, sitting up in the chair, in NAD. Neuro: A&O x 3, non-focal.  HEENT: [place]/AT. PERRL, sclerae anicteric. Off BiPAP. Cardiovascular: RRR, no M/R/G.  Lungs: Respirations shallow and unlabored.  CTA bilaterally, No W/R/R. Abdomen: BS x 4, soft, NT/ND.  Musculoskeletal: No gross deformities, no edema.  Skin: Intact, warm, no rashes.  LABS:  BMET  Recent Labs Lab 02/16/16 1225 02/17/16 0527 02/18/16 0249  NA 138 143 143  K 3.5 3.9 4.3  CL 97* 94* 90*  CO2 39* 43* 42*  BUN 27* 17 12  CREATININE 1.14 1.17 1.03  GLUCOSE 218* 172* 65    Electrolytes  Recent Labs Lab 02/16/16 1225 02/17/16 0527 02/18/16 0249  CALCIUM 8.6* 8.6* 8.5*    CBC  Recent Labs Lab  02/16/16 1225 02/18/16 0249  WBC 6.4 6.0  HGB 15.3 15.9  HCT 48.1 51.3  PLT 243 213    Coag's No results for input(s): APTT, INR in the last 168 hours.  Sepsis Markers No results for input(s): LATICACIDVEN, PROCALCITON, O2SATVEN in the last 168 hours.  ABG  Recent Labs Lab 02/17/16 0850 02/17/16 1559 02/18/16 0830  PHART 7.277* 7.325* 7.369  PCO2ART 99.2* 95.4* 86.5*  PO2ART 56.1* 40.6* 74.4*    Liver Enzymes No results for input(s): AST, ALT, ALKPHOS, BILITOT, ALBUMIN in the last 168 hours.  Cardiac Enzymes  Recent Labs Lab 02/16/16 2208 02/17/16 0527 02/17/16 0945  TROPONINI <0.03 <0.03 <0.03    Glucose  Recent Labs Lab 02/17/16 1300 02/17/16 1710 02/17/16 2047 02/17/16 2354 02/18/16 0440 02/18/16 0755  GLUCAP 118* 94 325* 164* 81 103*    Imaging No results found.   STUDIES:  09/09/15 Echo >> mild LVH, EF 65 to 70%, grade 1 diastolic CHF 12/31/15 PSG >> AHI 14.1, SpO2 low 59% 02/16/16 CT angio chest >> no PE, marked scoliosis, coronary calcification  CULTURES:  ANTIBIOTICS:  SIGNIFICANT EVENTS: 4/10 Admit  LINES/TUBES:  I reviewed CXR myself, pulmonary edema noted.  DISCUSSION: 55 yo male with acute on chronic hypoxic/hypercapnic respiratory failure in setting of morbid obesity, OSA, and severe scoliosis.  Has been on BiPAP overnight with not much ABG improvement. BiPAP settings reviewed and adjusted 4/11 AM with plans for ABG later, if not improved/worsening may need intubation.   ASSESSMENT / PLAN:  Acute on chronic hypoxic/hypercapnic respiratory failure. Decompensated OSA - on home CPAP (recently prescribed). ABG noted, patient is a chronic retainer, the CO2 level is likely baseline. - Change BiPAP to QHS and PRN. - No need for further ABGs at this point. - Oxygen to keep SpO2 88 to 95%. - Diuresis as below - CXR intermittently. - Avoid sedating medications - Oral care - Will need home BiPAP prior to discharge, CSW to  arrange.  Acute on chronic diastolic CHF Hx of HTN, HLD - Echo noted. - Continue diuresis. - Continue ASA, lipitor, lasix, lisinopril, lopressor.  Discussed with RT.  Alyson Reedy, M.D. [facility] Pulmonary/Critical Care Medicine. Pager: [phone]. After hours pager: [phone].

## 2016-02-18 NOTE — Progress Notes (Signed)
Came to room to perform ABG.  Pt bipap alarming, pt request for bipap to be taken off now. NO distress noted, pt is alert and talking.  Pt denies SOB.   Pt placed on Shane Hinton, will come back to draw ABG.

## 2016-02-19 DIAGNOSIS — J9602 Acute respiratory failure with hypercapnia: Secondary | ICD-10-CM

## 2016-02-19 DIAGNOSIS — Z930 Tracheostomy status: Secondary | ICD-10-CM | POA: Insufficient documentation

## 2016-02-19 DIAGNOSIS — J9601 Acute respiratory failure with hypoxia: Secondary | ICD-10-CM

## 2016-02-19 LAB — GLUCOSE, CAPILLARY
GLUCOSE-CAPILLARY: 152 mg/dL — AB (ref 65–99)
Glucose-Capillary: 102 mg/dL — ABNORMAL HIGH (ref 65–99)
Glucose-Capillary: 196 mg/dL — ABNORMAL HIGH (ref 65–99)
Glucose-Capillary: 198 mg/dL — ABNORMAL HIGH (ref 65–99)
Glucose-Capillary: 199 mg/dL — ABNORMAL HIGH (ref 65–99)
Glucose-Capillary: 88 mg/dL (ref 65–99)

## 2016-02-19 LAB — BASIC METABOLIC PANEL
ANION GAP: 10 (ref 5–15)
BUN: 13 mg/dL (ref 6–20)
CO2: 41 mmol/L — AB (ref 22–32)
Calcium: 8.5 mg/dL — ABNORMAL LOW (ref 8.9–10.3)
Chloride: 90 mmol/L — ABNORMAL LOW (ref 101–111)
Creatinine, Ser: 0.96 mg/dL (ref 0.61–1.24)
GFR calc Af Amer: >60 mL/min (ref >60)
GFR calc non Af Amer: >60 mL/min (ref >60)
GLUCOSE: 103 mg/dL — AB (ref 65–99)
POTASSIUM: 3.6 mmol/L (ref 3.5–5.1)
Sodium: 141 mmol/L (ref 135–145)

## 2016-02-19 MED ORDER — FUROSEMIDE 10 MG/ML IJ SOLN
40.0000 mg | Freq: Three times a day (TID) | INTRAMUSCULAR | Status: DC
  Administered 2016-02-19 – 2016-02-21 (×6): 40 mg via INTRAVENOUS
  Filled 2016-02-19 (×6): qty 4

## 2016-02-19 NOTE — Evaluation (Signed)
Occupational Therapy Evaluation Patient Details Name: Shane Hinton MRN: [id number] DOB: [birth year] Today's Date: 02/19/2016    History of Present Illness 55 yo male with DM/HTN/CHF, obesity, OSA/OHS presented to ER with dyspnea and leg swelling x 2 days. He had run out of his BP meds and lasix. He was hypoxic in ER, and ABG showed acute on chronic hypercapnia. He was started on BiPAP and admitted to SDU. Clinically felt to be volume overloaded and diuresing.    Clinical Impression   Pt admitted with above. He demonstrates the below listed deficits and will benefit from continued OT to maximize safety and independence with BADLs.  Pt presents to OT with generalized weakness, deconditioning, and decreased activity tolerance.  He requires min A for ADLs.  He will likely benefit from use of AD.  Recommend tub transfer bench for home use.       Follow Up Recommendations  No OT follow up;Supervision - Intermittent    Equipment Recommendations  Tub/shower bench    Recommendations for Other Services       Precautions / Restrictions Precautions Precautions: Fall Restrictions Weight Bearing Restrictions: No      Mobility Bed Mobility               General bed mobility comments: pt in chair upon entry to room.   Transfers Overall transfer level: Independent Equipment used: None             General transfer comment: Independent with sit to stand and able to stand without use of AD and No LOB.     Balance Overall balance assessment: Needs assistance Sitting-balance support: Feet supported Sitting balance-Leahy Scale: Good     Standing balance support: During functional activity;Single extremity supported Standing balance-Leahy Scale: Poor Standing balance comment: reliant on UEs                             ADL Overall ADL's : Needs assistance/impaired Eating/Feeding: Independent;Sitting   Grooming: Wash/dry hands;Wash/dry face;Oral  care;Brushing hair;Min guard;Standing   Upper Body Bathing: Set up;Supervision/ safety;Sitting   Lower Body Bathing: Minimal assistance;Sit to/from stand   Upper Body Dressing : Set up;Supervision/safety;Sitting   Lower Body Dressing: Minimal assistance;Sit to/from stand   Toilet Transfer: Min guard;Ambulation;Comfort height toilet;Grab bars;RW   Toileting- Clothing Manipulation and Hygiene: Minimal assistance;Sit to/from stand       Functional mobility during ADLs: Min guard;Rolling walker General ADL Comments: difficulty accessing feet      Vision     Perception     Praxis      Pertinent Vitals/Pain Pain Assessment: No/denies pain     Hand Dominance Right   Extremity/Trunk Assessment Upper Extremity Assessment Upper Extremity Assessment: Generalized weakness   Lower Extremity Assessment Lower Extremity Assessment: Defer to PT evaluation   Cervical / Trunk Assessment Cervical / Trunk Assessment: Normal   Communication Communication Communication: No difficulties   Cognition Arousal/Alertness: Awake/alert Behavior During Therapy: WFL for tasks assessed/performed Overall Cognitive Status: Within Functional Limits for tasks assessed                     General Comments       Exercises Exercises: General Upper Extremity     Shoulder Instructions      Home Living Family/patient expects to be discharged to:: Private residence Living Arrangements: Other relatives (niece) Available Help at Discharge: Family;Available 24 hours/day Type of Home: Apartment Home Access: Stairs to enter Entrance  Stairs-Number of Steps: 4 Entrance Stairs-Rails: Left Home Layout: One level     Bathroom Shower/Tub: Tub/shower unit Shower/tub characteristics: Engineer, building servicesCurtain Bathroom Toilet: Standard     Home Equipment: None          Prior Functioning/Environment Level of Independence: Needs assistance  Gait / Transfers Assistance Needed: independent ADL's /  Homemaking Assistance Needed: Pt required assist with grocery shopping, household management and meal prep.  Occasionally niece has to help him with LB ADLs        OT Diagnosis: Generalized weakness   OT Problem List: Decreased strength;Decreased activity tolerance;Impaired balance (sitting and/or standing);Decreased knowledge of use of DME or AE;Cardiopulmonary status limiting activity;Obesity   OT Treatment/Interventions: Self-care/ADL training;Therapeutic exercise;DME and/or AE instruction;Energy conservation;Therapeutic activities;Patient/family education;Balance training    OT Goals(Current goals can be found in the care plan section) Acute Rehab OT Goals Patient Stated Goal: to go home OT Goal Formulation: With patient Time For Goal Achievement: 03/04/16 Potential to Achieve Goals: Good ADL Goals Pt Will Perform Grooming: with modified independence;standing Pt Will Perform Upper Body Bathing: with modified independence;sitting;standing Pt Will Perform Lower Body Bathing: with modified independence;sit to/from stand Pt Will Perform Upper Body Dressing: with modified independence;sitting;standing Pt Will Perform Lower Body Dressing: with modified independence;sit to/from stand Pt Will Transfer to Toilet: with modified independence;ambulating;regular height toilet;grab bars Pt Will Perform Toileting - Clothing Manipulation and hygiene: with modified independence;sit to/from stand Pt Will Perform Tub/Shower Transfer: Tub transfer;with modified independence;ambulating;tub bench;rolling walker Pt/caregiver will Perform Home Exercise Program: Increased strength;Right Upper extremity;Left upper extremity;With theraband;With written HEP provided;Independently  OT Frequency: Min 2X/week   Barriers to D/C:            Co-evaluation              End of Session Equipment Utilized During Treatment: Rolling walker;Oxygen Nurse Communication: Mobility status  Activity Tolerance:  Patient tolerated treatment well Patient left: in chair;with call bell/phone within reach;with chair alarm set   Time: 1312-1340 OT Time Calculation (min): 28 min Charges:  OT General Charges $OT Visit: 1 Procedure OT Evaluation $OT Eval Moderate Complexity: 1 Procedure OT Treatments $Therapeutic Exercise: 8-22 mins G-Codes:    Landri Dorsainvil M 02/19/2016, 2:00 PM

## 2016-02-19 NOTE — Progress Notes (Signed)
[facility]                                                                              Patient Demographics  Shane Hinton, is a 55 y.o. male, DOB - 08/12/1961, RUE:[id number]  Admit date - 02/16/2016   Admitting Physician Therisa DoyneAnastassia Doutova, MD  Outpatient Primary MD for the patient is [facility]  LOS - 3  days    Chief Complaint  Patient presents with  . Shortness of Breath       Brief HPI  55 yo male with DM/HTN/CHF, obesity, OSA/OHS presented to ER with dyspnea and leg swelling x 2 days. He had run out of his BP meds and lasix. He was hypoxic in ER, and ABG showed acute on chronic hypercapnia. He was started on BiPAP and admitted to SDU. Clinically felt to be volume overloaded and diuresing. Remains acidotic and hypercarbic-but mentation stable, PCCM consulting    Assessment & Plan    Acute respiratory failure with hypoxia and hypercapnia (HCC) Decompensated OSA, on home CPAP however states did not receive the machine yet, obesity hypoventilation -due to Acute on chronic Diastolic CHF and OSA/OHS -Patient was placed on BiPAP acute resp acidosis with hypoxia and hypercapnia, this a.m. on examination, 6 L O2 via nasal cannula -diuresing well, negative 5.1 L, still 2+ pitting edema, will increase Lasix to 40mg  Q8hrs IV, follow renal function - 2-D echo showed EF of 60-65% with grade 1 diastolic dysfunction, elevated CVP, no pericardial effusion -Per pulmonology, will need home BiPAP, SW arranging - Avoid sedating meds, currently alert and oriented 3  Acute on chronic diastolic CHF -2-D echo with EF 60-65%, grade 1 diastolic dysfunction - Follow I's and O's and daily weights - Negative balance of 5.1 L, continue IV diuresis  Essential (primary) hypertension -stable, on lisinopril, metoprolol and lasix   Diabetes mellitus (HCC) -held metformin, SSI  OSA/OHS -s/p sleep study in 2/17 but never got CPAP machine,CM to  arrange for home BiPAP per pulmonology recommendations  Code Status: Full CODE STATUS  Family Communication: Discussed in detail with the patient, all imaging results, lab results explained to the patient   Disposition Plan: Hopefully DC next 24-48 hours  Time Spent in minutes   25 minutes  Procedures  BiPAP  Consults   Pulmonology  DVT Prophylaxis  Lovenox   Medications  Scheduled Meds: . antiseptic oral rinse  7 mL Mouth Rinse q12n4p  . aspirin EC  81 mg Oral Daily  . atorvastatin  40 mg Oral Daily  . chlorhexidine  15 mL Mouth Rinse BID  . enoxaparin (LOVENOX) injection  40 mg Subcutaneous Q24H  . furosemide  40 mg Intravenous Q8H  . insulin aspart  0-20 Units Subcutaneous 6 times per day  . lisinopril  40 mg Oral Daily  . metoprolol tartrate  50 mg Oral BID  . sodium chloride flush  3 mL Intravenous Q12H   Continuous Infusions:  PRN Meds:.sodium chloride, acetaminophen, ondansetron (ZOFRAN) IV, sodium chloride flush   Antibiotics   Anti-infectives    None        Subjective:   Shane Hinton  was seen and examined today. He feels a lot better today, on 6 L O2 via nasal cannula at the time of my exam, off BiPAP. Shortness of breath improving. Patient denies dizziness, chest pain,  abdominal pain, N/V/D/C, new weakness, numbess, tingling. No acute events overnight.    Objective:   Filed Vitals:   02/19/16 0500 02/19/16 0822 02/19/16 0902 02/19/16 0913  BP: 120/80  150/110 150/110  Pulse: 74 84  108  Temp:    98.4 F (36.9 C)  TempSrc:    Oral  Resp: 33 21  22  Height:      Weight: 127.3 kg (280 lb 10.3 oz)     SpO2: 90% 96%  92%    Intake/Output Summary (Last 24 hours) at 02/19/16 1042 Last data filed at 02/19/16 0905  Gross per 24 hour  Intake   1086 ml  Output   2850 ml  Net  -1764 ml     Wt Readings from Last 3 Encounters:  02/19/16 127.3 kg (280 lb 10.3 oz)  12/31/15 127.007 kg (280 lb)  12/05/15 127.007 kg (280 lb)      Exam  General: Alert and oriented x 3, NAD  HEENT:  PERRLA, EOMI, Anicteric Sclera, mucous membranes moist.   Neck: Supple, no JVD, no masses  CVS: S1 S2 auscultated, no rubs, murmurs or gallops. Regular rate and rhythm.  Respiratory: Diminished at the bases  Abdomen: Soft, nontender, nondistended, + bowel sounds  Ext: no cyanosis clubbing, 2+ edema  Neuro: AAOx3, Cr N's II- XII. Strength 5/5 upper and lower extremities bilaterally  Skin: No rashes  Psych: Normal affect and demeanor, alert and oriented x3    Data Reviewed:  I have personally reviewed following labs and imaging studies  Micro Results Recent Results (from the past 240 hour(s))  MRSA PCR Screening     Status: None   Collection Time: 02/16/16  9:22 PM  Result Value Ref Range Status   MRSA by PCR NEGATIVE NEGATIVE Final    Comment:        The GeneXpert MRSA Assay (FDA approved for NASAL specimens only), is one component of a comprehensive MRSA colonization surveillance program. It is not intended to diagnose MRSA infection nor to guide or monitor treatment for MRSA infections.     Radiology Reports Dg Chest 2 View  02/16/2016  CLINICAL DATA:  Cough and shortness of breath. Decreased left-sided breath sounds. EXAM: CHEST  2 VIEW COMPARISON:  09/10/2015 and 01/22/2015 and chest CT dated 09/08/2015 FINDINGS: Heart size and pulmonary vascularity are normal. The patient has a severe thoracolumbar scoliosis with chronic compressive atelectasis at the right lung base medially. No infiltrates or effusions.  No acute bone abnormality. IMPRESSION: No active cardiopulmonary disease. Electronically Signed   By: Francene Boyers M.D.   On: 02/16/2016 12:24   Ct Angio Chest Pe W/cm &/or Wo Cm  02/16/2016  CLINICAL DATA:  Hypoxia EXAM: CT ANGIOGRAPHY CHEST WITH CONTRAST TECHNIQUE: Multidetector CT imaging of the chest was performed using the standard protocol during bolus administration of intravenous contrast.  Multiplanar CT image reconstructions and MIPs were obtained to evaluate the vascular anatomy. CONTRAST:  100 mL Isovue 370 IV COMPARISON:  Chest x-ray 02/16/2016.  CT chest 09/08/2015 FINDINGS: Negative for pulmonary embolism. Normal aortic arch. Mild cardiac enlargement. Coronary calcification in the left coronary artery. Marked scoliosis in the thoracic spine. Mild atelectasis in lung bases, improved from 09/08/2015. Negative for pneumonia. No pleural effusion. Negative for mass or adenopathy. Upper abdomen  negative Review of the MIP images confirms the above findings. IMPRESSION: Negative for pulmonary embolism Mild bibasilar atelectasis. Moderate scoliosis. Electronically Signed   By: Marlan Palau M.D.   On: 02/16/2016 14:20   Dg Abd Portable 1v  02/16/2016  CLINICAL DATA:  Abdominal distention tonight.  Nausea. EXAM: PORTABLE ABDOMEN - 1 VIEW COMPARISON:  None. FINDINGS: The abdominal gas pattern is negative for obstruction or perforation. There is radiopaque material in the low pelvic midline. This could represent a bladder calculus but it may also represent radiopaque material within bowel. No ureteral calculi are evident. IMPRESSION: Question bladder calculus versus radiopaque material within bowel in the low pelvic midline. Negative for bowel obstruction or perforation. Electronically Signed   By: Ellery Plunk M.D.   On: 02/16/2016 23:01    CBC  Recent Labs Lab 02/16/16 1225 02/18/16 0249  WBC 6.4 6.0  HGB 15.3 15.9  HCT 48.1 51.3  PLT 243 213  MCV 94.5 98.5  MCH 30.1 30.5  MCHC 31.8 31.0  RDW 15.0 14.9  LYMPHSABS 1.7  --   MONOABS 0.7  --   EOSABS 0.1  --   BASOSABS 0.0  --     Chemistries   Recent Labs Lab 02/16/16 1225 02/17/16 0527 02/18/16 0249 02/19/16 0338  NA 138 143 143 141  K 3.5 3.9 4.3 3.6  CL 97* 94* 90* 90*  CO2 39* 43* 42* 41*  GLUCOSE 218* 172* 65 103*  BUN 27* CREATININE 1.14 1.17 1.03 0.96  CALCIUM 8.6* 8.6* 8.5* 8.5*    ------------------------------------------------------------------------------------------------------------------ estimated creatinine clearance is 117.1 mL/min (by C-G formula based on Cr of 0.96). ------------------------------------------------------------------------------------------------------------------  Recent Labs  02/17/16 0527  HGBA1C 8.9*   ------------------------------------------------------------------------------------------------------------------ No results for input(s): CHOL, HDL, LDLCALC, TRIG, CHOLHDL, LDLDIRECT in the last 72 hours. ------------------------------------------------------------------------------------------------------------------ No results for input(s): TSH, T4TOTAL, T3FREE, THYROIDAB in the last 72 hours.  Invalid input(s): FREET3 ------------------------------------------------------------------------------------------------------------------ No results for input(s): VITAMINB12, FOLATE, FERRITIN, TIBC, IRON, RETICCTPCT in the last 72 hours.  Coagulation profile No results for input(s): INR, PROTIME in the last 168 hours.  No results for input(s): DDIMER in the last 72 hours.  Cardiac Enzymes  Recent Labs Lab 02/16/16 2208 02/17/16 0527 02/17/16 0945  TROPONINI <0.03 <0.03 <0.03   ------------------------------------------------------------------------------------------------------------------ Invalid input(s): POCBNP   Recent Labs  02/18/16 1209 02/18/16 1611 02/18/16 2104 02/19/16 0024 02/19/16 0406 02/19/16 0819  GLUCAP 187* 185* 370* 199* 88 102*     RAI,RIPUDEEP M.D. [facility] 02/19/2016, 10:42 AM  Pager: [phone] Between 7am to 7pm - call Pager - [phone]  After 7pm go to [email or web address] - password [email or web address]  Call night coverage person covering after 7pm

## 2016-02-19 NOTE — Progress Notes (Signed)
PULMONARY / CRITICAL CARE MEDICINE   Name: Shane FallenKenneth Lodes MRN: [id number] DOB: 02/08/1961    ADMISSION DATE:  02/16/2016 CONSULTATION DATE:  02/17/2016  REFERRING MD:  [facility]  CHIEF COMPLAINT:  Shortness of the breath  HISTORY OF PRESENT ILLNESS:   55 yo male presented to ER with dyspnea and leg swelling x 2 days.  He had run out of his BP meds.  He was hypoxic in ER, and ABG showed acute on chronic hypercapnia.  He was started on BiPAP and admitted to SDU by hospitalist.  He had persistent hypercapnia, and PCCM asked to assess respiratory failure.  He had admission in November 2016 for the same and required intubation at that time.  He had sleep study 01/04/16 which revealed OSA (AHI 14.1/hr) and was started on CPAP.   SUBJECTIVE:   Up in a chair, talking on the phone.  VITAL SIGNS: BP 106/73 mmHg  Pulse 81  Temp(Src) 98.5 F (36.9 C) (Oral)  Resp 20  Ht 5\' 8"  (1.727 m)  Wt 127.3 kg (280 lb 10.3 oz)  BMI 42.68 kg/m2  SpO2 98%  HEMODYNAMICS:    VENTILATOR SETTINGS: Vent Mode:  [-] BIPAP FiO2 (%):  [40 %] 40 % Set Rate:  [20 bmp] 20 bmp PEEP:  [5 cmH20] 5 cmH20  INTAKE / OUTPUT: I/O last 3 completed shifts: In: 1083 [P.O.:1080; I.V.:3] Out: 3190 [Urine:3190]  PHYSICAL EXAMINATION: General: Adult AA male, sitting up in the chair, in NAD. Neuro: A&O x 3, non-focal.  HEENT: [place]/AT. PERRL, sclerae anicteric. Off BiPAP. Cardiovascular: RRR, no M/R/G.  Lungs: Respirations shallow and unlabored.  CTA bilaterally, No W/R/R. Abdomen: BS x 4, soft, NT/ND.  Musculoskeletal: No gross deformities, no edema.  Skin: Intact, warm, no rashes.  LABS:  BMET  Recent Labs Lab 02/17/16 0527 02/18/16 0249 02/19/16 0338  NA 143 143 141  K 3.9 4.3 3.6  CL 94* 90* 90*  CO2 43* 42* 41*  BUN 17 12 13   CREATININE 1.17 1.03 0.96  GLUCOSE 172* 65 103*    Electrolytes  Recent Labs Lab 02/17/16 0527 02/18/16 0249 02/19/16 0338  CALCIUM 8.6* 8.5* 8.5*    CBC  Recent  Labs Lab 02/16/16 1225 02/18/16 0249  WBC 6.4 6.0  HGB 15.3 15.9  HCT 48.1 51.3  PLT 243 213    Coag's No results for input(s): APTT, INR in the last 168 hours.  Sepsis Markers No results for input(s): LATICACIDVEN, PROCALCITON, O2SATVEN in the last 168 hours.  ABG  Recent Labs Lab 02/17/16 0850 02/17/16 1559 02/18/16 0830  PHART 7.277* 7.325* 7.369  PCO2ART 99.2* 95.4* 86.5*  PO2ART 56.1* 40.6* 74.4*    Liver Enzymes No results for input(s): AST, ALT, ALKPHOS, BILITOT, ALBUMIN in the last 168 hours.  Cardiac Enzymes  Recent Labs Lab 02/16/16 2208 02/17/16 0527 02/17/16 0945  TROPONINI <0.03 <0.03 <0.03    Glucose  Recent Labs Lab 02/18/16 1611 02/18/16 2104 02/19/16 0024 02/19/16 0406 02/19/16 0819 02/19/16 1210  GLUCAP 185* 370* 199* 88 102* 196*    Imaging No results found.   STUDIES:  09/09/15 Echo >> mild LVH, EF 65 to 70%, grade 1 diastolic CHF 12/31/15 PSG >> AHI 14.1, SpO2 low 59% 02/16/16 CT angio chest >> no PE, marked scoliosis, coronary calcification  CULTURES:  ANTIBIOTICS:  SIGNIFICANT EVENTS: 4/10 Admit  LINES/TUBES:  I reviewed CXR myself, pulmonary edema noted.  DISCUSSION: 55 yo male with acute on chronic hypoxic/hypercapnic respiratory failure in setting of morbid obesity, OSA, and severe  scoliosis. Has been on BiPAP overnight with not much ABG improvement. BiPAP settings reviewed and adjusted 4/11 AM with plans for ABG later, if not improved/worsening may need intubation.   ASSESSMENT / PLAN:  Acute on chronic hypoxic/hypercapnic respiratory failure. Decompensated OSA - on home CPAP (recently prescribed). ABG noted, patient is a chronic retainer, the CO2 level is likely baseline. - Change BiPAP to QHS and PRN. - No need for further ABGs at this point. - Oxygen to keep SpO2 88 to 95%. - Diuresis 40 mg IV q8 hours. - CXR intermittently. - Avoid sedating medications. - Oral care. - Will need home BiPAP prior to  discharge, CSW to arrange.  Acute on chronic diastolic CHF Hx of HTN, HLD - Echo noted. - Continue diuresis. - Continue ASA, lipitor, lasix, lisinopril, lopressor.  Discussed with RT.  Alyson Reedy, M.D. [facility] Pulmonary/Critical Care Medicine. Pager: [phone]. After hours pager: [phone].

## 2016-02-19 NOTE — Progress Notes (Signed)
Physical Therapy Treatment Patient Details Name: Shane Hinton MRN: [id number] DOB: December 01, 1960 Today's Date: 02/19/2016    History of Present Illness 55 yo male with DM/HTN/CHF, obesity, OSA/OHS presented to ER with dyspnea and leg swelling x 2 days. He had run out of his BP meds and lasix. He was hypoxic in ER, and ABG showed acute on chronic hypercapnia. He was started on BiPAP and admitted to SDU. Clinically felt to be volume overloaded and diuresing.     PT Comments    Pt was able to increase ambulation distance today but still has very little activity tolerance. Pt states that he feels his heart racing and feels SOB if he walks too far. His HR increased to 106 bpm and RR increased to 32 with Sp)2 94% on 6L [place]. Educated pt on the importance of ambulating several times a day once he returns home since he is not eligible for further therapy services through his insurance after d/c. Also educated him on an appropriate home exercise program that he can maintain on his own. Pt will continue to benefit from PT services while he remains in the hospital to increase activity tolerance and decrease fall risk.    Follow Up Recommendations  No PT follow up;Supervision/Assistance - 24 hour     Equipment Recommendations  Other (comment) (Rollator and tub bench)    Recommendations for Other Services       Precautions / Restrictions Precautions Precautions: Fall Restrictions Weight Bearing Restrictions: No    Mobility  Bed Mobility               General bed mobility comments: pt in chair upon entry to room.   Transfers Overall transfer level: Independent Equipment used: None             General transfer comment: Independent with sit to stand and able to stand without use of AD and No LOB.   Ambulation/Gait Ambulation/Gait assistance: Min guard Ambulation Distance (Feet): 100 Feet (20(sitting rest break) +30 (standing rest break) +50) Assistive device: Rolling walker (2  wheeled) Gait Pattern/deviations: Step-through pattern;Decreased stride length;Decreased dorsiflexion - right;Decreased dorsiflexion - left;Trendelenburg;Trunk flexed;Wide base of support Gait velocity: very slow Gait velocity interpretation: Below normal speed for age/gender General Gait Details: Pt has very slow steady gait with RW. Decreased dorsiflexion bilaterally.    Stairs            Wheelchair Mobility    Modified Rankin (Stroke Patients Only)       Balance Overall balance assessment: Needs assistance Sitting-balance support: No upper extremity supported;Feet supported Sitting balance-Leahy Scale: Good     Standing balance support: Bilateral upper extremity supported Standing balance-Leahy Scale: Fair Standing balance comment: reliant on UE support.                     Cognition Arousal/Alertness: Awake/alert Behavior During Therapy: WFL for tasks assessed/performed Overall Cognitive Status: Within Functional Limits for tasks assessed                      Exercises General Exercises - Lower Extremity Ankle Circles/Pumps: AROM;10 reps;Both;Seated Long Arc Quad: AROM;Both;15 reps;Seated Hip Flexion/Marching: AROM;Both;15 reps;Seated Mini-Sqauts: AROM;Both;10 reps;Seated    General Comments General comments (skin integrity, edema, etc.): Pt very pleasant and cooperative with therapy.       Pertinent Vitals/Pain Pain Assessment: No/denies pain    Home Living  Prior Function            PT Goals (current goals can now be found in the care plan section) Acute Rehab PT Goals Patient Stated Goal: to go home PT Goal Formulation: With patient Time For Goal Achievement: 03/03/16 Potential to Achieve Goals: Good Progress towards PT goals: Progressing toward goals    Frequency  Min 3X/week    PT Plan Current plan remains appropriate    Co-evaluation             End of Session Equipment Utilized  During Treatment: Gait belt;Oxygen Activity Tolerance: Patient tolerated treatment well Patient left: in chair;with call bell/phone within reach;with chair alarm set     Time: [id number] PT Time Calculation (min) (ACUTE ONLY): 20 min  Charges:  $Gait Training: 8-22 mins                    G Codes:      Everlean CherryJenna Pebbles Zeiders, SPT Everlean CherryJenna Ponce Skillman 02/19/2016, 10:46 AM

## 2016-02-19 NOTE — Progress Notes (Signed)
PULMONARY / CRITICAL CARE MEDICINE   Name: Shane Hinton MRN: [id number] DOB: 01-12-1961    ADMISSION DATE:  02/16/2016 CONSULTATION DATE:  02/17/2016  REFERRING MD:  [facility]  CHIEF COMPLAINT:  Shortness of the breath  HISTORY OF PRESENT ILLNESS:   55 yo male presented to ER with dyspnea and leg swelling x 2 days.  He had run out of his BP meds.  He was hypoxic in ER, and ABG showed acute on chronic hypercapnia.  He was started on BiPAP and admitted to SDU by hospitalist.  He had persistent hypercapnia, and PCCM asked to assess respiratory failure.  He had admission in November 2016 for the same and required intubation at that time.  He had sleep study 01/04/16 which revealed OSA (AHI 14.1/hr) and was started on CPAP although pt says he never got a CPAP at home.   SUBJECTIVE:   Up in a chair, talking on the phone. Wore bipap overnight.  No c/o.    VITAL SIGNS: BP 106/73 mmHg  Pulse 81  Temp(Src) 98.5 F (36.9 C) (Oral)  Resp 20  Ht  (1.727 m)  Wt 127.3 kg (280 lb 10.3 oz)  BMI 42.68 kg/m2  SpO2 98%  HEMODYNAMICS:    VENTILATOR SETTINGS: Vent Mode:  [-] BIPAP FiO2 (%):  [40 %] 40 % Set Rate:  [20 bmp] 20 bmp PEEP:  [5 cmH20] 5 cmH20  INTAKE / OUTPUT: I/O last 3 completed shifts: In: 1083 [P.O.:1080; I.V.:3] Out: 3190 [Urine:3190]  PHYSICAL EXAMINATION: General: Adult AA male, sitting up in the chair, in NAD. Neuro: A&O x 3, non-focal.  HEENT: [place]/AT. PERRL, sclerae anicteric. Off BiPAP. Cardiovascular: RRR, no M/R/G.  Lungs: Respirations even non labored on [place], diminished bases otherwise clear  Abdomen: BS x 4, soft, NT/ND.  Musculoskeletal: No gross deformities, no edema.  Skin: Intact, warm, no rashes.  LABS:  BMET  Recent Labs Lab 02/17/16 0527 02/18/16 0249 02/19/16 0338  NA 143 143 141  K 3.9 4.3 3.6  CL 94* 90* 90*  CO2 43* 42* 41*  BUN CREATININE 1.17 1.03 0.96  GLUCOSE 172* 65 103*    Electrolytes  Recent Labs Lab  02/17/16 0527 02/18/16 0249 02/19/16 0338  CALCIUM 8.6* 8.5* 8.5*    CBC  Recent Labs Lab 02/16/16 1225 02/18/16 0249  WBC 6.4 6.0  HGB 15.3 15.9  HCT 48.1 51.3  PLT 243 213    Coag's No results for input(s): APTT, INR in the last 168 hours.  Sepsis Markers No results for input(s): LATICACIDVEN, PROCALCITON, O2SATVEN in the last 168 hours.  ABG  Recent Labs Lab 02/17/16 0850 02/17/16 1559 02/18/16 0830  PHART 7.277* 7.325* 7.369  PCO2ART 99.2* 95.4* 86.5*  PO2ART 56.1* 40.6* 74.4*    Liver Enzymes No results for input(s): AST, ALT, ALKPHOS, BILITOT, ALBUMIN in the last 168 hours.  Cardiac Enzymes  Recent Labs Lab 02/16/16 2208 02/17/16 0527 02/17/16 0945  TROPONINI <0.03 <0.03 <0.03    Glucose  Recent Labs Lab 02/18/16 1611 02/18/16 2104 02/19/16 0024 02/19/16 0406 02/19/16 0819 02/19/16 1210  GLUCAP 185* 370* 199* 88 102* 196*    Imaging No results found.   STUDIES:  09/09/15 Echo >> mild LVH, EF 65 to 70%, grade 1 diastolic CHF 12/31/15 PSG >> AHI 14.1, SpO2 low 59% 02/16/16 CT angio chest >> no PE, marked scoliosis, coronary calcification  CULTURES:  ANTIBIOTICS:  SIGNIFICANT EVENTS: 4/10 Admit  LINES/TUBES:   DISCUSSION: 55 yo male with acute  on chronic hypoxic/hypercapnic respiratory failure in setting of morbid obesity, decompensated OSA, and severe scoliosis. Improved with bipap, diuresis.    ASSESSMENT / PLAN:  Acute on chronic hypoxic/hypercapnic respiratory failure. Decompensated OSA - on home CPAP (recently prescribed). ABG noted, patient is a chronic retainer, the CO2 level is likely baseline. PLAN -  Cont qhs bipap  Supplemental O2 as needed to keep SpO2 88 to 95% Diuresis per primary  Int f/u CXR  Avoid sedating medications  Home bipap -- SW arranging  Will need outpt pulm f/u    Acute on chronic diastolic CHF Hx of HTN, HLD - Echo noted. - Continue diuresis. - Continue ASA, lipitor, lasix,  lisinopril, lopressor.  Dirk DressKaty Whiteheart, NP 02/19/2016  1:39 PM Pager: [phone] or [phone]  Alyson ReedyWesam G. Hulen Mandler, M.D. [facility]. Pager: [phone]. After hours pager: [phone]

## 2016-02-20 DIAGNOSIS — I5031 Acute diastolic (congestive) heart failure: Secondary | ICD-10-CM

## 2016-02-20 DIAGNOSIS — I5032 Chronic diastolic (congestive) heart failure: Secondary | ICD-10-CM

## 2016-02-20 LAB — GLUCOSE, CAPILLARY
GLUCOSE-CAPILLARY: 118 mg/dL — AB (ref 65–99)
GLUCOSE-CAPILLARY: 112 mg/dL — AB (ref 65–99)
GLUCOSE-CAPILLARY: 176 mg/dL — AB (ref 65–99)
GLUCOSE-CAPILLARY: 262 mg/dL — AB (ref 65–99)
Glucose-Capillary: 160 mg/dL — ABNORMAL HIGH (ref 65–99)
Glucose-Capillary: 130 mg/dL — ABNORMAL HIGH (ref 65–99)

## 2016-02-20 NOTE — Progress Notes (Signed)
[facility]                                                                              Patient Demographics  Shane Hinton, is a 55 y.o. male, DOB - 11/04/1961, ZOX:[id number]  Admit date - 02/16/2016   Admitting Physician Therisa DoyneAnastassia Doutova, MD  Outpatient Primary MD for the patient is [facility]  LOS - 4  days    Chief Complaint  Patient presents with  . Shortness of Breath       Brief HPI  55 yo male with DM/HTN/CHF, obesity, OSA/OHS presented to ER with dyspnea and leg swelling x 2 days. He had run out of his BP meds and lasix. He was hypoxic in ER, and ABG showed acute on chronic hypercapnia. He was started on BiPAP and admitted to SDU. Clinically felt to be volume overloaded and diuresing. Remains acidotic and hypercarbic-but mentation stable, PCCM consulting    Assessment & Plan    Acute respiratory failure with hypoxia and hypercapnia (HCC) Decompensated OSA, on home CPAP however states did not receive the machine yet, obesity hypoventilation -due to Acute on chronic Diastolic CHF and OSA/OHS -Patient was placed on BiPAP acute resp acidosis with hypoxia and hypercapnia, this a.m. on examination, 6 L O2 via nasal cannula -Much better diuresis after increasing Lasix dose, 02 sats 99% on 4 L, weaned down  - 2-D echo showed EF of 60-65% with grade 1 diastolic dysfunction, elevated CVP, no pericardial effusion -Per pulmonology, will need home BiPAP, SW/cm arranging - Avoid sedating meds, currently alert and oriented 3  Acute on chronic diastolic CHF- Improving -2-D echo with EF 60-65%, grade 1 diastolic dysfunction - Good diuresis, negative balance of 8.1 L, weight 300lbs ->285 - Continue IV Lasix 40 mg q8hrs, was taking oral 40mg  daily lasix at home  Essential (primary) hypertension -stable, on lisinopril, metoprolol and lasix   Diabetes mellitus (HCC) -held metformin, SSI  OSA/OHS -s/p sleep study in 2/17 but  never got CPAP machine,CM to arrange for home BiPAP per pulmonology recommendations  Code Status: Full CODE STATUS  Family Communication: Discussed in detail with the patient, all imaging results, lab results explained to the patient   Disposition Plan: Hopefully DC next 24-48 hours  Time Spent in minutes   25 minutes  Procedures  BiPAP  Consults   Pulmonology  DVT Prophylaxis  Lovenox   Medications  Scheduled Meds: . antiseptic oral rinse  7 mL Mouth Rinse q12n4p  . aspirin EC  81 mg Oral Daily  . atorvastatin  40 mg Oral Daily  . chlorhexidine  15 mL Mouth Rinse BID  . enoxaparin (LOVENOX) injection  40 mg Subcutaneous Q24H  . furosemide  40 mg Intravenous Q8H  . insulin aspart  0-20 Units Subcutaneous 6 times per day  . lisinopril  40 mg Oral Daily  . metoprolol tartrate  50 mg Oral BID  . sodium chloride flush  3 mL Intravenous Q12H   Continuous Infusions:  PRN Meds:.sodium chloride, acetaminophen, ondansetron (ZOFRAN) IV, sodium chloride flush   Antibiotics   Anti-infectives    None  Subjective:   [name] was seen and examined today.He feels a lot better today, lower extremity swelling improving, shortness of breath improving. Wean to 4 L O2 via nasal cannula.  Patient denies dizziness, chest pain,  abdominal pain, N/V/D/C, new weakness, numbess, tingling. No acute events overnight.    Objective:   Filed Vitals:   02/20/16 0223 02/20/16 0404 02/20/16 0817 02/20/16 0845  BP:  103/59  131/92  Pulse: 82 74 78 81  Temp:  97.8 F (36.6 C)  98.6 F (37 C)  TempSrc:  Axillary  Oral  Resp: Height:      Weight:  129.4 kg (285 lb 4.4 oz)    SpO2: 97% 94% 100% 99%    Intake/Output Summary (Last 24 hours) at 02/20/16 1144 Last data filed at 02/20/16 0900  Gross per 24 hour  Intake      3 ml  Output   3000 ml  Net  -2997 ml     Wt Readings from Last 3 Encounters:  02/20/16 129.4 kg (285 lb 4.4 oz)  12/31/15 127.007 kg  (280 lb)  12/05/15 127.007 kg (280 lb)     Exam  General: Alert and oriented x 3, NAD  HEENT:    Neck: Supple, no JVD, no masses  CVS: S1 S2 auscultated, no rubs, murmurs or gallops. Regular rate and rhythm.  Respiratory: Diminished at the bases  Abdomen: Soft, nontender, nondistended, + bowel sounds  Ext: no cyanosis clubbing, 2+ edema  Neuro: no new deficits  Skin: No rashes  Psych: Normal affect and demeanor, alert and oriented x3    Data Reviewed:  I have personally reviewed following labs and imaging studies  Micro Results Recent Results (from the past 240 hour(s))  MRSA PCR Screening     Status: None   Collection Time: 02/16/16  9:22 PM  Result Value Ref Range Status   MRSA by PCR NEGATIVE NEGATIVE Final    Comment:        The GeneXpert MRSA Assay (FDA approved for NASAL specimens only), is one component of a comprehensive MRSA colonization surveillance program. It is not intended to diagnose MRSA infection nor to guide or monitor treatment for MRSA infections.     Radiology Reports Dg Chest 2 View  02/16/2016  CLINICAL DATA:  Cough and shortness of breath. Decreased left-sided breath sounds. EXAM: CHEST  2 VIEW COMPARISON:  09/10/2015 and 01/22/2015 and chest CT dated 09/08/2015 FINDINGS: Heart size and pulmonary vascularity are normal. The patient has a severe thoracolumbar scoliosis with chronic compressive atelectasis at the right lung base medially. No infiltrates or effusions.  No acute bone abnormality. IMPRESSION: No active cardiopulmonary disease. Electronically Signed   By: Francene Boyers M.D.   On: 02/16/2016 12:24   Ct Angio Chest Pe W/cm &/or Wo Cm  02/16/2016  CLINICAL DATA:  Hypoxia EXAM: CT ANGIOGRAPHY CHEST WITH CONTRAST TECHNIQUE: Multidetector CT imaging of the chest was performed using the standard protocol during bolus administration of intravenous contrast. Multiplanar CT image reconstructions and MIPs were obtained to evaluate the  vascular anatomy. CONTRAST:  100 mL Isovue 370 IV COMPARISON:  Chest x-ray 02/16/2016.  CT chest 09/08/2015 FINDINGS: Negative for pulmonary embolism. Normal aortic arch. Mild cardiac enlargement. Coronary calcification in the left coronary artery. Marked scoliosis in the thoracic spine. Mild atelectasis in lung bases, improved from 09/08/2015. Negative for pneumonia. No pleural effusion. Negative for mass or adenopathy. Upper abdomen negative Review of the MIP images confirms the  above findings. IMPRESSION: Negative for pulmonary embolism Mild bibasilar atelectasis. Moderate scoliosis. Electronically Signed   By: Marlan Palau M.D.   On: 02/16/2016 14:20   Dg Abd Portable 1v  02/16/2016  CLINICAL DATA:  Abdominal distention tonight.  Nausea. EXAM: PORTABLE ABDOMEN - 1 VIEW COMPARISON:  None. FINDINGS: The abdominal gas pattern is negative for obstruction or perforation. There is radiopaque material in the low pelvic midline. This could represent a bladder calculus but it may also represent radiopaque material within bowel. No ureteral calculi are evident. IMPRESSION: Question bladder calculus versus radiopaque material within bowel in the low pelvic midline. Negative for bowel obstruction or perforation. Electronically Signed   By: Ellery Plunk M.D.   On: 02/16/2016 23:01    CBC  Recent Labs Lab 02/16/16 1225 02/18/16 0249  WBC 6.4 6.0  HGB 15.3 15.9  HCT 48.1 51.3  PLT 243 213  MCV 94.5 98.5  MCH 30.1 30.5  MCHC 31.8 31.0  RDW 15.0 14.9  LYMPHSABS 1.7  --   MONOABS 0.7  --   EOSABS 0.1  --   BASOSABS 0.0  --     Chemistries   Recent Labs Lab 02/16/16 1225 02/17/16 0527 02/18/16 0249 02/19/16 0338  NA 138 143 143 141  K 3.5 3.9 4.3 3.6  CL 97* 94* 90* 90*  CO2 39* 43* 42* 41*  GLUCOSE 218* 172* 65 103*  BUN 27* CREATININE 1.14 1.17 1.03 0.96  CALCIUM 8.6* 8.6* 8.5* 8.5*    ------------------------------------------------------------------------------------------------------------------ estimated creatinine clearance is 118.1 mL/min (by C-G formula based on Cr of 0.96). ------------------------------------------------------------------------------------------------------------------ No results for input(s): HGBA1C in the last 72 hours. ------------------------------------------------------------------------------------------------------------------ No results for input(s): CHOL, HDL, LDLCALC, TRIG, CHOLHDL, LDLDIRECT in the last 72 hours. ------------------------------------------------------------------------------------------------------------------ No results for input(s): TSH, T4TOTAL, T3FREE, THYROIDAB in the last 72 hours.  Invalid input(s): FREET3 ------------------------------------------------------------------------------------------------------------------ No results for input(s): VITAMINB12, FOLATE, FERRITIN, TIBC, IRON, RETICCTPCT in the last 72 hours.  Coagulation profile No results for input(s): INR, PROTIME in the last 168 hours.  No results for input(s): DDIMER in the last 72 hours.  Cardiac Enzymes  Recent Labs Lab 02/16/16 2208 02/17/16 0527 02/17/16 0945  TROPONINI <0.03 <0.03 <0.03   ------------------------------------------------------------------------------------------------------------------ Invalid input(s): POCBNP   Recent Labs  02/19/16 1210 02/19/16 1647 02/19/16 1944 02/20/16 0045 02/20/16 0415 02/20/16 0825  GLUCAP 196* 152* 198* 160* 118* 112*     Chaundra Abreu M.D. [facility] 02/20/2016, 11:44 AM  Pager: [phone] Between 7am to 7pm - call Pager - [phone]  After 7pm go to [email or web address] - password [email or web address]  Call night coverage person covering after 7pm

## 2016-02-20 NOTE — Care Management Note (Signed)
Case Management Note  Patient Details  Name: Shane Hinton MRN: [id number] Date of Birth: 11/23/1960  Subjective/Objective:      Pt states he did have sleep study in February but never got the CPAP, will need BiPAP when discharged.  His home O2 is through [facility], notified them of need for BiPAP.                     Expected Discharge Plan:  Home/Self Care  Discharge planning Services  CM Consult  DME Arranged:  Bipap DME Agency:  [facility]  Status of Service:  In process, will continue to follow  Magdalene RiverMayo, Antanasia Kaczynski T, RN 02/20/2016, 9:38 AM

## 2016-02-20 NOTE — Progress Notes (Signed)
Physical Therapy Treatment Patient Details Name: Shane FallenKenneth Helton MRN: [id number] DOB: 01/31/1961 Today's Date: 02/20/2016    History of Present Illness 55 yo male with DM/HTN/CHF, obesity, OSA/OHS presented to ER with dyspnea and leg swelling x 2 days. He had run out of his BP meds and lasix. He was hypoxic in ER, and ABG showed acute on chronic hypercapnia. He was started on BiPAP and admitted to SDU. Clinically felt to be volume overloaded and diuresing.     PT Comments    Pt able to improve his ambulation distance today and tolerated it on 4L [place] with SpO2 92-100%. HR increased to 103bpm and RR increased to 32 towards end of walk. Educated pt on importance of LE exercises throughout the day and to walk more than once to help build his activity tolerance. Pt continues to make improvement with therapy and will continue to benefit from PT services while in the hospital to improve functional mobility. Pt would also benefit from HHPT upon discharge but pt is not eligible for PT follow up through his insurance.    Follow Up Recommendations  No PT follow up;Supervision/Assistance - 24 hour     Equipment Recommendations  Other (comment) (Bariatric rollator and tub bench)    Recommendations for Other Services       Precautions / Restrictions Precautions Precautions: Fall Restrictions Weight Bearing Restrictions: No    Mobility  Bed Mobility Overal bed mobility: Modified Independent             General bed mobility comments: Pt able to get to EOB independently with HOB elevated.   Transfers Overall transfer level: Independent Equipment used: None             General transfer comment: Independent with sit to stand and able to stand without use of AD and No LOB.   Ambulation/Gait Ambulation/Gait assistance: Min guard Ambulation Distance (Feet): 200 Feet (25 (standing rest break) + 175) Assistive device: Rolling walker (2 wheeled) Gait Pattern/deviations: Step-through  pattern;Decreased stride length;Decreased dorsiflexion - right;Decreased dorsiflexion - left;Trunk flexed;Wide base of support Gait velocity: very slow Gait velocity interpretation: Below normal speed for age/gender General Gait Details: Pt has very slow steady gait with RW. Decreased dorsiflexion bilaterally.    Stairs            Wheelchair Mobility    Modified Rankin (Stroke Patients Only)       Balance Overall balance assessment: Needs assistance Sitting-balance support: No upper extremity supported;Feet unsupported Sitting balance-Leahy Scale: Good     Standing balance support: Bilateral upper extremity supported Standing balance-Leahy Scale: Fair Standing balance comment: Reliant on UE's for dyanamic balance but able to stand up from chair and stand statically with no UE support.                     Cognition Arousal/Alertness: Awake/alert Behavior During Therapy: WFL for tasks assessed/performed Overall Cognitive Status: Within Functional Limits for tasks assessed                      Exercises General Exercises - Lower Extremity Mini-Sqauts: Both;Strengthening;15 reps;Standing    General Comments General comments (skin integrity, edema, etc.): Pt very pleasant and cooperative with therapy.       Pertinent Vitals/Pain Pain Assessment: No/denies pain    Home Living                      Prior Function  PT Goals (current goals can now be found in the care plan section) Acute Rehab PT Goals Patient Stated Goal: to go home  PT Goal Formulation: With patient Time For Goal Achievement: 03/03/16 Potential to Achieve Goals: Good Progress towards PT goals: Progressing toward goals    Frequency  Min 3X/week    PT Plan Current plan remains appropriate    Co-evaluation             End of Session Equipment Utilized During Treatment: Gait belt;Oxygen Activity Tolerance: Patient tolerated treatment well Patient  left: in chair;with call bell/phone within reach;with chair alarm set     Time: [phone] PT Time Calculation (min) (ACUTE ONLY): 13 min  Charges:  $Gait Training: 8-22 mins                    G Codes:      Everlean Cherry, SPT Everlean Cherry 02/20/2016, 10:27 AM

## 2016-02-21 LAB — GLUCOSE, CAPILLARY
GLUCOSE-CAPILLARY: 133 mg/dL — AB (ref 65–99)
GLUCOSE-CAPILLARY: 87 mg/dL (ref 65–99)
Glucose-Capillary: 115 mg/dL — ABNORMAL HIGH (ref 65–99)

## 2016-02-21 LAB — BASIC METABOLIC PANEL
Anion gap: 6 (ref 5–15)
BUN: 7 mg/dL (ref 6–20)
CHLORIDE: 92 mmol/L — AB (ref 101–111)
CO2: 43 mmol/L — ABNORMAL HIGH (ref 22–32)
Calcium: 8.8 mg/dL — ABNORMAL LOW (ref 8.9–10.3)
Creatinine, Ser: 0.87 mg/dL (ref 0.61–1.24)
GFR calc Af Amer: >60 mL/min (ref >60)
GLUCOSE: 95 mg/dL (ref 65–99)
POTASSIUM: 3.8 mmol/L (ref 3.5–5.1)
Sodium: 141 mmol/L (ref 135–145)

## 2016-02-21 MED ORDER — FUROSEMIDE 20 MG PO TABS: 60.0000 mg | ORAL_TABLET | Freq: Two times a day (BID) | ORAL | Status: DC

## 2016-02-21 MED ORDER — POTASSIUM CHLORIDE ER 20 MEQ PO TBCR: 20.0000 meq | EXTENDED_RELEASE_TABLET | Freq: Every day | ORAL | Status: AC

## 2016-02-21 MED ORDER — FUROSEMIDE 40 MG PO TABS: 60.0000 mg | ORAL_TABLET | Freq: Two times a day (BID) | ORAL | Status: DC

## 2016-02-21 NOTE — Discharge Summary (Signed)
Physician Discharge Summary   Patient ID: Shane FallenKenneth Barrales MRN: [id number] DOB/AGE: 53/05/1961 53 y.o.  Admit date: 02/16/2016 Discharge date: 02/21/2016  Primary Care Physician:  [facility]  Discharge Diagnoses:      Acute on chronic diastolic CHF exacerbation  . Hypercarbia . Acute respiratory failure with hypoxia and hypercapnia (HCC) . type 2 diabetes mellitus  . Essential (primary) hypertension . Morbid obesity (HCC) . Obesity hypoventilation syndrome (HCC)  Consults:  Pulmonology  Recommendations for Outpatient Follow-up:  1. Home health will set up home CPAP/BiPAP 2. Please repeat CBC/BMET at next visit 3. Patient needs second sleep study for home CPAP. He needs to be scheduled for it ASAP outpatient   DIET: Carb modified diet    Allergies:  No Known Allergies   DISCHARGE MEDICATIONS: Current Discharge Medication List    CONTINUE these medications which have CHANGED   Details  furosemide (LASIX) 20 MG tablet Take 3 tablets (60 mg total) by mouth 2 (two) times daily. Qty: 180 tablet, Refills: 3    Potassium Chloride ER 20 MEQ TBCR Take 20 mEq by mouth daily. Qty: 30 tablet, Refills: 3      CONTINUE these medications which have NOT CHANGED   Details  aspirin EC 81 MG tablet Take 1 tablet (81 mg total) by mouth daily. Qty: 30 tablet, Refills: 2    atorvastatin (LIPITOR) 40 MG tablet Take 1 tablet (40 mg total) by mouth daily. Qty: 90 tablet, Refills: 3   Associated Diagnoses: Hyperlipidemia    lisinopril (PRINIVIL,ZESTRIL) 40 MG tablet Take 40 mg by mouth daily.    metFORMIN (GLUCOPHAGE) 500 MG tablet Take 1 tablet (500 mg total) by mouth 2 (two) times daily with a meal. Qty: 60 tablet, Refills: 3   Associated Diagnoses: Type 2 diabetes mellitus without complication, without long-term current use of insulin (HCC)    metoprolol (LOPRESSOR) 50 MG tablet Take 1 tablet (50 mg total) by mouth 2 (two) times daily. Qty: 60 tablet,  Refills: 11    nitroGLYCERIN (NITROSTAT) 0.4 MG SL tablet Place 1 tablet (0.4 mg total) under the tongue every 5 (five) minutes as needed for chest pain. Qty: 30 tablet, Refills: 1    TRUEPLUS LANCETS 28G MISC 1 each by Does not apply route daily. Qty: 30 each, Refills: 5    Blood Glucose Monitoring Suppl (TRUE METRIX METER) DEVI 1 each by Does not apply route daily. Qty: 1 Device, Refills: 0    glucose blood (TRUE METRIX BLOOD GLUCOSE TEST) test strip Use as instructed Qty: 100 each, Refills: 12      STOP taking these medications     gabapentin (NEURONTIN) 300 MG capsule          Brief H and P: For complete details please refer to admission H and P, but in [age] yo male with DM/HTN/CHF, obesity, OSA/OHS presented to ER with dyspnea and leg swelling x 2 days. He had run out of his BP meds and lasix. He was hypoxic in ER, and ABG showed acute on chronic hypercapnia. He was started on BiPAP and admitted to SDU. Clinically felt to be volume overloaded and diuresing.   Hospital Course:   Acute respiratory failure with hypoxia and hypercapnia (HCC) Decompensated OSA, on home CPAP however states did not receive the machine yet, obesity hypoventilation. Chronic diastolic CHF -due to Acute on chronic Diastolic CHF and OSA/OHS -Patient was placed on BiPAP for acute resp acidosis with hypoxia and hypercapnia, currently 6 L O2 via nasal  cannula -Much better after IV diuresis, negative balance of 12.8 L, weight down from 300->280lbs at discharge  - 2-D echo showed EF of 60-65% with grade 1 diastolic dysfunction, elevated CVP, no pericardial effusion - Per pulmonology, will need home BiPAP/CPAP, SW/cm arranging however patient needs second sleep study asap. - Avoid sedating meds, currently alert and oriented 3. Neurontin was discontinued.  Acute on chronic diastolic CHF- Improving -2-D echo with EF 60-65%, grade 1 diastolic dysfunction - Good diuresis, negative balance of 12.8L,  weight 300lbs ->280  -Patient was placed on IV diuresis, he was taking oral 40mg  daily lasix at home, now increased to 60 mg twice a day with potassium supplementation. Patient has cardiology appointment on 4/18 with Dr. Jens Som - Patient was strongly recommended diet restrictions, low-salt diet.  Essential (primary) hypertension -stable, on lisinopril, metoprolol and lasix   Diabetes mellitus (HCC) -Metformin was held while inpatient  OSA/OHS -s/p sleep study in 2/17 but never got CPAP machine, per case management, patient will need another sleep study. Recommended patient to call pulmonology office on Monday to schedule it ASAP. Home O2 evaluation will be done prior to discharge. Patient is requesting to be discharged home.  Day of Discharge BP 135/85 mmHg  Pulse 92  Temp(Src) 98.3 F (36.8 C) (Oral)  Resp 23  Ht 5\' 8"  (1.727 m)  Wt 127.37 kg (280 lb 12.8 oz)  BMI 42.71 kg/m2  SpO2 96%  Physical Exam: General: Alert and awake oriented x3 not in any acute distress. HEENT: anicteric sclera, pupils reactive to light and accommodation CVS: S1-S2 clear no murmur rubs or gallops Chest: clear to auscultation bilaterally, no wheezing rales or rhonchi Abdomen: Morbidly obese soft nontender, nondistended, normal bowel sounds Extremities: no cyanosis, clubbing, 1-2+ edema noted bilaterally Neuro: Cranial nerves II-XII intact, no focal neurological deficits   The results of significant diagnostics from this hospitalization (including imaging, microbiology, ancillary and laboratory) are listed below for reference.    LAB RESULTS: Basic Metabolic Panel:  Recent Labs Lab 02/19/16 0338 02/21/16 0452  NA 141 141  K 3.6 3.8  CL 90* 92*  CO2 41* 43*  GLUCOSE 103* 95  BUN 13 7  CREATININE 0.96 0.87  CALCIUM 8.5* 8.8*   Liver Function Tests: No results for input(s): AST, ALT, ALKPHOS, BILITOT, PROT, ALBUMIN in the last 168 hours. No results for input(s): LIPASE, AMYLASE in the  last 168 hours. No results for input(s): AMMONIA in the last 168 hours. CBC:  Recent Labs Lab 02/16/16 1225 02/18/16 0249  WBC 6.4 6.0  NEUTROABS 4.0  --   HGB 15.3 15.9  HCT 48.1 51.3  MCV 94.5 98.5  PLT 243 213   Cardiac Enzymes:  Recent Labs Lab 02/17/16 0527 02/17/16 0945  TROPONINI <0.03 <0.03   BNP: Invalid input(s): POCBNP CBG:  Recent Labs Lab 02/21/16 0427 02/21/16 0741  GLUCAP 87 115*    Significant Diagnostic Studies:  Dg Chest 2 View  02/16/2016  CLINICAL DATA:  Cough and shortness of breath. Decreased left-sided breath sounds. EXAM: CHEST  2 VIEW COMPARISON:  09/10/2015 and 01/22/2015 and chest CT dated 09/08/2015 FINDINGS: Heart size and pulmonary vascularity are normal. The patient has a severe thoracolumbar scoliosis with chronic compressive atelectasis at the right lung base medially. No infiltrates or effusions.  No acute bone abnormality. IMPRESSION: No active cardiopulmonary disease. Electronically Signed   By: Francene Boyers M.D.   On: 02/16/2016 12:24   Ct Angio Chest Pe W/cm &/or Wo Cm  02/16/2016  CLINICAL DATA:  Hypoxia EXAM: CT ANGIOGRAPHY CHEST WITH CONTRAST TECHNIQUE: Multidetector CT imaging of the chest was performed using the standard protocol during bolus administration of intravenous contrast. Multiplanar CT image reconstructions and MIPs were obtained to evaluate the vascular anatomy. CONTRAST:  100 mL Isovue 370 IV COMPARISON:  Chest x-ray 02/16/2016.  CT chest 09/08/2015 FINDINGS: Negative for pulmonary embolism. Normal aortic arch. Mild cardiac enlargement. Coronary calcification in the left coronary artery. Marked scoliosis in the thoracic spine. Mild atelectasis in lung bases, improved from 09/08/2015. Negative for pneumonia. No pleural effusion. Negative for mass or adenopathy. Upper abdomen negative Review of the MIP images confirms the above findings. IMPRESSION: Negative for pulmonary embolism Mild bibasilar atelectasis. Moderate  scoliosis. Electronically Signed   By: Marlan Palau M.D.   On: 02/16/2016 14:20   Dg Abd Portable 1v  02/16/2016  CLINICAL DATA:  Abdominal distention tonight.  Nausea. EXAM: PORTABLE ABDOMEN - 1 VIEW COMPARISON:  None. FINDINGS: The abdominal gas pattern is negative for obstruction or perforation. There is radiopaque material in the low pelvic midline. This could represent a bladder calculus but it may also represent radiopaque material within bowel. No ureteral calculi are evident. IMPRESSION: Question bladder calculus versus radiopaque material within bowel in the low pelvic midline. Negative for bowel obstruction or perforation. Electronically Signed   By: Ellery Plunk M.D.   On: 02/16/2016 23:01    2D ECHO: Study Conclusions  - Procedure narrative: Transthoracic echocardiography. Image  quality was suboptimal. The study was technically difficult, as a  result of restricted patient mobility and body habitus.  Intravenous contrast (Definity) was administered. - Left ventricle: The cavity size was normal. Wall thickness was  increased in a pattern of mild LVH. Systolic function was normal.  The estimated ejection fraction was in the range of 60% to 65%.  Doppler parameters are consistent with abnormal left ventricular  relaxation (grade 1 diastolic dysfunction). The E/e&' ratio is  between 8-15, suggesting indeterminate LV filling pressure. - Left atrium: The atrium was normal in size. - Inferior vena cava: The vessel was dilated. The respirophasic  diameter changes were blunted (< 50%), consistent with elevated  central venous pressure.  Impressions:  - Technically difficulty study. Definity contrast was administered.  LVEF 60-65% diastolic dysfunction with indeterminate LV filling  pressure. Findings similar to prior study in 09/2015, except EF  was 65-70% at the time.  Disposition and Follow-up: Discharge Instructions    (HEART FAILURE PATIENTS) Call MD:   Anytime you have any of the following symptoms: 1) 3 pound weight gain in 24 hours or 5 pounds in 1 week 2) shortness of breath, with or without a dry hacking cough 3) swelling in the hands, feet or stomach 4) if you have to sleep on extra pillows at night in order to breathe.    Complete by:  As directed      Diet Carb Modified    Complete by:  As directed      Increase activity slowly    Complete by:  As directed             DISPOSITION: home    DISCHARGE FOLLOW-UP Follow-up Information    Follow up with [facility].   Why:  CPAP   Contact information:   [address] [place] [address] [phone]       Follow up with [facility]. Schedule an appointment as soon as possible for a visit in  10 days.   Why:  for hospital follow-up   Contact information:   [address] [place] [address] [phone]       Follow up with Olga Millers, MD On 02/24/2016.   Specialty:  Cardiology   Why:  at 10:45AM , for hospital follow-up   Contact information:   [address] [place] [address] [phone]       Follow up with Oretha Milch., MD. Call on 02/23/2016.   Specialty:  Pulmonary Disease   Why:  please call on Monday for hospital follow-up, you need sleep study asap.    Contact information:   [address] [place] [address] [phone]        Time spent on Discharge:   Signed:   RAI,RIPUDEEP M.D. [facility] 02/21/2016, 11:01 AM Pager: [phone]

## 2016-02-21 NOTE — Progress Notes (Signed)
Went over all discharge instruction with pt - given prescriptions. Went over Heart Failure teaching sheet. MD instructed pt not to eat Ham because it has too much salt. ( Easter holiday tomorrow).

## 2016-02-21 NOTE — Progress Notes (Signed)
Pt discharged per w/c with all belongings,( Cell phone and charger) & prescriptions. Sister took pt home via private care

## 2016-02-23 MED FILL — ATORVASTATIN 40 MG TABLET: 40 | 30 days supply | Qty: 30 | Fill #1

## 2016-02-23 MED FILL — POTASSIUM CL ER 20 MEQ TAB: 20 | 30 days supply | Qty: 30 | Fill #1

## 2016-02-23 MED FILL — FUROSEMIDE 40 MG TABLET: 40 | 30 days supply | Qty: 30 | Fill #1

## 2016-02-23 MED FILL — metFORMIN HCL 500 MG TABS: 500 | 30 days supply | Qty: 60 | Fill #1

## 2016-02-23 MED FILL — NITROSTAT 0.4 MG TABLET SL: 0.4 | 25 days supply | Qty: 25 | Fill #1

## 2016-02-23 MED FILL — LISINOPRIL 20 MG TABLET: 20 | 30 days supply | Qty: 30 | Fill #1

## 2016-02-23 MED FILL — METOPROLOL TARTRATE 50 MG T: 50 | 30 days supply | Qty: 60 | Fill #0

## 2016-02-23 NOTE — Progress Notes (Signed)
HPI: FU congestive heart failure. Nuclear study January 2017 showed ejection fraction 45%. No ischemia. Echocardiogram April 2017 was technically difficult but showed normal LV function, grade 1 diastolic dysfunction. CTA April 2017 showed no pulmonary embolus. Admitted April 2017 with acute respiratory failure felt secondary to a combination of sleep apnea, obesity hypoventilation syndrome and diastolic congestive heart failure. Patient was diuresed with improvement. Since discharge he notes some dyspnea on exertion but no orthopnea or PND. Continued pedal edema unchanged. No chest pain or syncope.  Current Outpatient Prescriptions  Medication Sig Dispense Refill  . aspirin EC 81 MG tablet Take 1 tablet (81 mg total) by mouth daily. 30 tablet 2  . atorvastatin (LIPITOR) 40 MG tablet Take 1 tablet (40 mg total) by mouth daily. 90 tablet 3  . Blood Glucose Monitoring Suppl (TRUE METRIX METER) DEVI 1 each by Does not apply route daily. 1 Device 0  . furosemide (LASIX) 20 MG tablet Take 3 tablets (60 mg total) by mouth 2 (two) times daily. 180 tablet 3  . glucose blood (TRUE METRIX BLOOD GLUCOSE TEST) test strip Use as instructed 100 each 12  . lisinopril (PRINIVIL,ZESTRIL) 40 MG tablet Take 40 mg by mouth daily.    . metFORMIN (GLUCOPHAGE) 500 MG tablet Take 1 tablet (500 mg total) by mouth 2 (two) times daily with a meal. 60 tablet 3  . metoprolol (LOPRESSOR) 50 MG tablet Take 1 tablet (50 mg total) by mouth 2 (two) times daily. 60 tablet 11  . nitroGLYCERIN (NITROSTAT) 0.4 MG SL tablet Place 1 tablet (0.4 mg total) under the tongue every 5 (five) minutes as needed for chest pain. 30 tablet 1  . Potassium Chloride ER 20 MEQ TBCR Take 20 mEq by mouth daily. 30 tablet 3  . TRUEPLUS LANCETS 28G MISC 1 each by Does not apply route daily. 30 each 5   No current facility-administered medications for this visit.     Past Medical History  Diagnosis Date  . Hypertension   . Diabetes mellitus  (HCC) 09/23/2015  . Hyperlipidemia   . Chronic diastolic (congestive) heart failure (HCC) 11/25/2015  . Influenza with pneumonia 2016  . OSA (obstructive sleep apnea)   . Scoliosis   . Chronic respiratory failure [facility](HCC)     Past Surgical History  Procedure Laterality Date  . No previous surgery      Social History   Social History  . Marital Status: Legally Separated    Spouse Name: N/A  . Number of Children: N/A  . Years of Education: N/A   Occupational History  . Not on file.   Social History Main Topics  . Smoking status: Never Smoker   . Smokeless tobacco: Not on file  . Alcohol Use: No  . Drug Use: No  . Sexual Activity: Not on file   Other Topics Concern  . Not on file   Social History Narrative    Family History  Problem Relation Age of Onset  . Diabetes Mother   . Hypertension Mother   . Kidney disease Mother     ROS: no fevers or chills, productive cough, hemoptysis, dysphasia, odynophagia, melena, hematochezia, dysuria, hematuria, rash, seizure activity, orthopnea, PND, claudication. Remaining systems are negative.  Physical Exam: Well-developed obese in no acute distress.  Skin is warm and dry.  HEENT is normal.  Neck is supple.  Chest is clear to auscultation with normal expansion.  Cardiovascular exam is regular rate and rhythm.  Abdominal exam nontender or  distended. No masses palpated. Extremities show 2+ edema. neuro grossly intact

## 2016-02-24 ENCOUNTER — Encounter: Payer: Self-pay | Admitting: Cardiology

## 2016-02-24 ENCOUNTER — Ambulatory Visit (INDEPENDENT_AMBULATORY_CARE_PROVIDER_SITE_OTHER): Payer: Medicaid Other | Admitting: Cardiology

## 2016-02-24 VITALS — BP 152/98 | HR 84 | Ht 68.0 in | Wt 282.0 lb

## 2016-02-24 DIAGNOSIS — R0600 Dyspnea, unspecified: Secondary | ICD-10-CM | POA: Diagnosis not present

## 2016-02-24 DIAGNOSIS — R072 Precordial pain: Secondary | ICD-10-CM

## 2016-02-24 DIAGNOSIS — G4733 Obstructive sleep apnea (adult) (pediatric): Secondary | ICD-10-CM

## 2016-02-24 DIAGNOSIS — Z9989 Dependence on other enabling machines and devices: Secondary | ICD-10-CM

## 2016-02-24 DIAGNOSIS — I5032 Chronic diastolic (congestive) heart failure: Secondary | ICD-10-CM

## 2016-02-24 DIAGNOSIS — E785 Hyperlipidemia, unspecified: Secondary | ICD-10-CM | POA: Diagnosis not present

## 2016-02-24 DIAGNOSIS — I10 Essential (primary) hypertension: Secondary | ICD-10-CM | POA: Diagnosis not present

## 2016-02-24 MED ORDER — SPIRONOLACTONE 25 MG PO TABS: 25.0000 mg | ORAL_TABLET | Freq: Every day | ORAL | Status: DC

## 2016-02-24 MED ORDER — AMLODIPINE BESYLATE 5 MG PO TABS: 5.0000 mg | ORAL_TABLET | Freq: Every day | ORAL | Status: DC

## 2016-02-24 MED ORDER — FUROSEMIDE 40 MG PO TABS: 80.0000 mg | ORAL_TABLET | Freq: Two times a day (BID) | ORAL | Status: AC

## 2016-02-24 MED ORDER — AMLODIPINE BESYLATE 5 MG PO TABS
5.0000 mg | ORAL_TABLET | Freq: Every day | ORAL | Status: DC
Start: 2016-02-24 — End: 2016-02-24

## 2016-02-24 MED ORDER — SPIRONOLACTONE 25 MG PO TABS: 25.0000 mg | ORAL_TABLET | Freq: Every day | ORAL | Status: AC

## 2016-02-24 MED ORDER — FUROSEMIDE 40 MG PO TABS: 80.0000 mg | ORAL_TABLET | Freq: Two times a day (BID) | ORAL | Status: DC

## 2016-02-24 MED FILL — AMLODIPINE BESYLATE 5 MG TA: 5 | 30 days supply | Qty: 30 | Fill #0

## 2016-02-24 MED FILL — SPIRONOLACTONE 25 MG TABLET: 25 | 30 days supply | Qty: 30 | Fill #0

## 2016-02-24 NOTE — Assessment & Plan Note (Signed)
We discussed the importance of weight loss. 

## 2016-02-24 NOTE — Assessment & Plan Note (Signed)
Likely multifactorial including obesity hypoventilation syndrome,Deconditioning, diastolic congestive heart failure and obstructive sleep apnea.

## 2016-02-24 NOTE — Assessment & Plan Note (Signed)
Continue statin. Check lipids and liver. 

## 2016-02-24 NOTE — Assessment & Plan Note (Addendum)
Patient is volume overloaded. Increase Lasix to 80 mg twice a day and add spironolactone 25 mg daily. Check potassium and renal function in 1 week. Continue low-sodium diet and fluid restriction. He will need follow-up with one of our extenders in 6 weeks. Follow-up with me in 3 months.

## 2016-02-24 NOTE — Assessment & Plan Note (Signed)
We will arrange for pulmonary evaluation. His sleep apnea needs to be treated.

## 2016-02-24 NOTE — Patient Instructions (Signed)
Medication Instructions:   INCREASE FUROSEMIDE TO 80 MG TWICE DAILY= 2 OF THE 40 MG TABLETS TWICE DAILY  START SPIRONOLACTONE 25 MG ONCE DAILY  START AMLODIPINE 5 MG ONCE DAILY  Labwork:  Your physician recommends that you return for lab work in: ONE WEEK  Follow-Up:  Your physician recommends that you schedule a follow-up appointment in: 6 WEEKS WITH APP  Your physician recommends that you schedule a follow-up appointment in: 3 MONTHS WITH DR CRENSHAW    REFERRAL TO PULMONARY FOR SLEEP APNEA

## 2016-02-24 NOTE — Assessment & Plan Note (Signed)
Previous nuclear study showed no ischemia.

## 2016-02-24 NOTE — Assessment & Plan Note (Addendum)
Blood pressure elevated. Add amlodipine 5 mg daily and adjust as needed.

## 2016-03-03 ENCOUNTER — Other Ambulatory Visit: Payer: Self-pay | Admitting: *Deleted

## 2016-03-03 DIAGNOSIS — E785 Hyperlipidemia, unspecified: Secondary | ICD-10-CM

## 2016-04-07 ENCOUNTER — Telehealth: Payer: Self-pay

## 2016-04-07 ENCOUNTER — Ambulatory Visit (INDEPENDENT_AMBULATORY_CARE_PROVIDER_SITE_OTHER): Payer: Medicaid Other | Admitting: Cardiology

## 2016-04-07 DIAGNOSIS — E785 Hyperlipidemia, unspecified: Secondary | ICD-10-CM

## 2016-04-07 DIAGNOSIS — I110 Hypertensive heart disease with heart failure: Secondary | ICD-10-CM | POA: Insufficient documentation

## 2016-04-07 DIAGNOSIS — I5032 Chronic diastolic (congestive) heart failure: Secondary | ICD-10-CM

## 2016-04-07 DIAGNOSIS — G4733 Obstructive sleep apnea (adult) (pediatric): Secondary | ICD-10-CM

## 2016-04-07 NOTE — Telephone Encounter (Signed)
Paper work received for pt to get updated oxygen orders for his home use during the day and at night due to insurance status change to Medicare. Pt was getting his home oxygen for free from [facility] for his CPAP use but now has Medicare and all orders and face to face OV notes have to be within 30 days for pt to continue use of oxygen and they can charge.  Pt appt today 04/07/16 with L. [facility] rescheduled for [facility]. [place] on 04/29/16. Spoke with Judeth CornfieldStephanie made aware pt had sleep study in 12/2015 but that is not within the 30 timeline for the Oxygen order for Medicare; also aware of updated appt for pt.  Pt paperwork returned to medical records to provide to provider during next appt to sign and send to [facility].

## 2016-04-13 MED FILL — SPIRONOLACTONE 25 MG TABLET: 25 | 30 days supply | Qty: 30 | Fill #1

## 2016-04-13 MED FILL — ATORVASTATIN 40 MG TABLET: 40 | 30 days supply | Qty: 30 | Fill #2

## 2016-04-13 MED FILL — METOPROLOL TARTRATE 50 MG T: 50 | 30 days supply | Qty: 60 | Fill #1

## 2016-04-13 MED FILL — metFORMIN HCL 500 MG TABS: 500 | 30 days supply | Qty: 60 | Fill #2

## 2016-04-13 MED FILL — LISINOPRIL 20 MG TABLET: 20 | 30 days supply | Qty: 30 | Fill #2

## 2016-04-13 MED FILL — AMLODIPINE BESYLATE 5 MG TA: 5 | 30 days supply | Qty: 30 | Fill #1

## 2016-04-21 ENCOUNTER — Institutional Professional Consult (permissible substitution): Payer: Medicaid Other | Admitting: Pulmonary Disease

## 2016-04-29 ENCOUNTER — Ambulatory Visit (INDEPENDENT_AMBULATORY_CARE_PROVIDER_SITE_OTHER): Payer: Medicaid Other | Admitting: Cardiology

## 2016-04-29 ENCOUNTER — Encounter: Payer: Self-pay | Admitting: Cardiology

## 2016-04-29 VITALS — BP 160/100 | HR 83 | Ht 68.0 in | Wt 272.2 lb

## 2016-04-29 DIAGNOSIS — I110 Hypertensive heart disease with heart failure: Secondary | ICD-10-CM

## 2016-04-29 DIAGNOSIS — I10 Essential (primary) hypertension: Secondary | ICD-10-CM

## 2016-04-29 DIAGNOSIS — G4733 Obstructive sleep apnea (adult) (pediatric): Secondary | ICD-10-CM

## 2016-04-29 DIAGNOSIS — E662 Morbid (severe) obesity with alveolar hypoventilation: Secondary | ICD-10-CM | POA: Diagnosis not present

## 2016-04-29 DIAGNOSIS — I5032 Chronic diastolic (congestive) heart failure: Secondary | ICD-10-CM | POA: Diagnosis not present

## 2016-04-29 DIAGNOSIS — Z9989 Dependence on other enabling machines and devices: Secondary | ICD-10-CM

## 2016-04-29 LAB — BASIC METABOLIC PANEL
BUN: 18 mg/dL (ref 7–25)
CALCIUM: 9.4 mg/dL (ref 8.6–10.3)
CO2: 30 mmol/L (ref 20–31)
Chloride: 107 mmol/L (ref 98–110)
Creat: 1.29 mg/dL (ref 0.70–1.33)
GLUCOSE: 122 mg/dL — AB (ref 65–99)
POTASSIUM: 4.5 mmol/L (ref 3.5–5.3)
SODIUM: 143 mmol/L (ref 135–146)

## 2016-04-29 MED ORDER — AMLODIPINE BESYLATE 10 MG PO TABS: 10.0000 mg | ORAL_TABLET | Freq: Every day | ORAL | Status: AC

## 2016-04-29 NOTE — Progress Notes (Signed)
Cardiology Office Note   Date:  04/29/2016   ID:  Shane Hinton, DOB 08-29-1961, MRN [id number]  PCP:  No primary care provider on file.  Cardiologist:  Dr. Jens Som    Chief Complaint  Patient presents with  . Shortness of Breath      History of Present Illness: Shane Hinton is a 55 y.o. male who presents for dyspnea.   He has a hx. Of congestive heart failure. Nuclear study January 2017 showed ejection fraction 45%. No ischemia. Echocardiogram April 2017 was technically difficult but showed normal LV function, grade 1 diastolic dysfunction. CTA April 2017 showed no pulmonary embolus. Admitted April 2017 with acute respiratory failure felt secondary to a combination of sleep apnea, obesity hypoventilation syndrome and diastolic congestive heart failure. Patient was diuresed with improvement. Since discharge he notes some dyspnea on exertion but no orthopnea or PND. Continued pedal edema unchanged. No chest pain or syncope.  Amlodipine added in April with visit with Dr. Jens Som. Along with increasing lasix to BID, and spironolactone 25 daily.  For his OSA he was to see pulmonary but missed appt, now to see them in August.    Today his wt is down 10 lbs, he stated he is trying to lose wt as well.  One episode of chest pressure yesterday.  He does not have CPAP yet.   He needed new orders for home oxygen therapy. He has been using for some time.  SP02 with rest on RA was 92% but exertion was 86%.  Back on O2 2L sp02 back up to 91%.    Past Medical History  Diagnosis Date  . Hypertension   . Diabetes mellitus (HCC) 09/23/2015  . Hyperlipidemia   . Chronic diastolic (congestive) heart failure (HCC) 11/25/2015  . Influenza with pneumonia 2016  . OSA (obstructive sleep apnea)   . Scoliosis   . Chronic respiratory failure [facility])     Past Surgical History  Procedure Laterality Date  . No previous surgery       Current Outpatient Prescriptions  Medication Sig Dispense Refill    . amLODipine (NORVASC) 5 MG tablet Take 1 tablet (5 mg total) by mouth daily. 180 tablet 3  . aspirin EC 81 MG tablet Take 1 tablet (81 mg total) by mouth daily. 30 tablet 2  . atorvastatin (LIPITOR) 40 MG tablet Take 1 tablet (40 mg total) by mouth daily. 90 tablet 3  . Blood Glucose Monitoring Suppl (TRUE METRIX METER) DEVI 1 each by Does not apply route daily. 1 Device 0  . furosemide (LASIX) 40 MG tablet Take 2 tablets (80 mg total) by mouth 2 (two) times daily. 360 tablet 3  . glucose blood (TRUE METRIX BLOOD GLUCOSE TEST) test strip Use as instructed 100 each 12  . lisinopril (PRINIVIL,ZESTRIL) 40 MG tablet Take 40 mg by mouth daily.    . metFORMIN (GLUCOPHAGE) 500 MG tablet Take 1 tablet (500 mg total) by mouth 2 (two) times daily with a meal. 60 tablet 3  . metoprolol (LOPRESSOR) 50 MG tablet Take 1 tablet (50 mg total) by mouth 2 (two) times daily. 60 tablet 11  . nitroGLYCERIN (NITROSTAT) 0.4 MG SL tablet Place 1 tablet (0.4 mg total) under the tongue every 5 (five) minutes as needed for chest pain. 30 tablet 1  . Potassium Chloride ER 20 MEQ TBCR Take 20 mEq by mouth daily. 30 tablet 3  . spironolactone (ALDACTONE) 25 MG tablet Take 1 tablet (25 mg total) by mouth daily. 90 tablet  3  . TRUEPLUS LANCETS 28G MISC 1 each by Does not apply route daily. 30 each 5   No current facility-administered medications for this visit.    Allergies:   Review of patient's allergies indicates no known allergies.    Social History:  The patient  reports that he has never smoked. He does not have any smokeless tobacco history on file. He reports that he does not drink alcohol or use illicit drugs.   Family History:  The patient's family history includes Diabetes in his mother; Hypertension in his mother; Kidney disease in his mother.    ROS:  General:no colds or fevers, + weight loss Skin:no rashes or ulcers HEENT:no blurred vision, no congestion CV:see HPI PUL:see HPI GI:no diarrhea  constipation or melena, no indigestion GU:no hematuria, no dysuria MS:no joint pain, no claudication- still some lower ext. Edema.  Neuro:no syncope, no lightheadedness Endo:+ diabetes, no thyroid disease  Wt Readings from Last 3 Encounters:  04/29/16 272 lb 3.2 oz (123.469 kg)  02/24/16 282 lb (127.914 kg)  02/21/16 280 lb 12.8 oz (127.37 kg)     PHYSICAL EXAM: VS:  BP 160/100 mmHg  Pulse 83  Ht 5\' 8"  (1.727 m)  Wt 272 lb 3.2 oz (123.469 kg)  BMI 41.40 kg/m2  SpO2 88% , BMI Body mass index is 41.4 kg/(m^2). General:Pleasant affect, NAD Skin:Warm and dry, brisk capillary refill HEENT:normocephalic, sclera clear, mucus membranes moist Neck:supple, no JVD, no bruits  Heart:S1S2 RRR without murmur, gallup, rub or click Lungs:clear without rales, rhonchi, or wheezes ZOX:WRUEAbd:soft, non tender, + BS, do not palpate liver spleen or masses Ext:tr lower ext edema, 2+ pedal pulses, 2+ radial pulses Neuro:alert and oriented X 3, MAE, follows commands, + facial symmetry    EKG:  EKG is NOT ordered today.    Recent Labs: 09/08/2015: ALT 153* 09/11/2015: Magnesium 2.4; TSH 2.728 02/16/2016: B Natriuretic Peptide 310.6* 02/18/2016: Hemoglobin 15.9; Platelets 213 02/21/2016: BUN 7; Creatinine, Ser 0.87; Potassium 3.8; Sodium 141    Lipid Panel    Component Value Date/Time   TRIG 99 09/13/2015 0506       Other studies Reviewed: Additional studies/ records that were reviewed today include: last OV notes, hospitalization.   ECHO 4/11/7 Study Conclusions  - Procedure narrative: Transthoracic echocardiography. Image  quality was suboptimal. The study was technically difficult, as a  result of restricted patient mobility and body habitus.  Intravenous contrast (Definity) was administered. - Left ventricle: The cavity size was normal. Wall thickness was  increased in a pattern of mild LVH. Systolic function was normal.  The estimated ejection fraction was in the range of 60% to  65%.  Doppler parameters are consistent with abnormal left ventricular  relaxation (grade 1 diastolic dysfunction). The E/e&' ratio is  between 8-15, suggesting indeterminate LV filling pressure. - Left atrium: The atrium was normal in size. - Inferior vena cava: The vessel was dilated. The respirophasic  diameter changes were blunted (< 50%), consistent with elevated  central venous pressure.  Impressions:  - Technically difficulty study. Definity contrast was administered.  LVEF 60-65% diastolic dysfunction with indeterminate LV filling  pressure. Findings similar to prior study in 09/2015, except EF  was 65-70% at the time.  ASSESSMENT AND PLAN:  1.  Chronic diastolic HF. Stable though he still has mild edema.  Wt is down 10 lbs.  Will leave lasix where it is and check BMP today.  Keep follow up with Dr. Jens Somrenshaw.  2.  HTN continues, he states  he is taking his meds.  No salt.  No fast foods.  Will increase amlodipine to 10 mg.  This may cause increased leg edema but not sure he would take hydralazine.    3.  hypoxia with need for home oxygen.  He continues to desat.  Papers filled out to continue home oxugen.  3. OSA to see pul in August.   4. Morbid obesity, continue to lose wt.        Current medicines are reviewed with the patient today.  The patient Has no concerns regarding medicines.  The following changes have been made:  See above Labs/ tests ordered today include:see above  Disposition:   FU:  see above  Signed, Nada BoozerLaura Axyl Sitzman, NP  04/29/2016 1:48 PM    [facility] [address], [place], [place]  [address]/ [address] [place], [place] Phone: [phone]; Fax: [phone]  [phone]

## 2016-04-29 NOTE — Patient Instructions (Signed)
Medication Instructions:  INCREASE Amlodipine (Norvasc) to 10 mg daily   Labwork: TODAY - basic metabolic panel   Testing/Procedures: None Ordered   Follow-Up: Your physician recommends that you keep your follow-up appointment on: July 18 with Dr. Jens Somrenshaw   If you need a refill on your cardiac medications before your next appointment, please call your pharmacy.   Thank you for choosing [facility]! Eligha BridegroomMichelle Faizan Geraci, RN [phone]

## 2016-05-05 ENCOUNTER — Telehealth: Payer: Self-pay | Admitting: Cardiology

## 2016-05-05 NOTE — Telephone Encounter (Signed)
New message     Need oxygen order and oximetry test form that was faxed to us.  Please fax to [phone]

## 2016-05-05 NOTE — Telephone Encounter (Signed)
Left msg for Shane GeorgeStephanie w [facility] confirming our office fax number and phone number and to call back if questions.

## 2016-05-20 NOTE — Progress Notes (Signed)
HPI: FU congestive heart failure. Nuclear study January 2017 showed ejection fraction 45%. No ischemia. Echocardiogram April 2017 was technically difficult but showed normal LV function, grade 1 diastolic dysfunction. CTA April 2017 showed no pulmonary embolus. Admitted April 2017 with acute respiratory failure felt secondary to a combination of sleep apnea, obesity hypoventilation syndrome and diastolic congestive heart failure. Patient was diuresed with improvement. Since last seen,   Current Outpatient Prescriptions  Medication Sig Dispense Refill  . amLODipine (NORVASC) 10 MG tablet Take 1 tablet (10 mg total) by mouth daily. 30 tablet 11  . aspirin EC 81 MG tablet Take 1 tablet (81 mg total) by mouth daily. 30 tablet 2  . atorvastatin (LIPITOR) 40 MG tablet Take 1 tablet (40 mg total) by mouth daily. 90 tablet 3  . Blood Glucose Monitoring Suppl (TRUE METRIX METER) DEVI 1 each by Does not apply route daily. 1 Device 0  . furosemide (LASIX) 40 MG tablet Take 2 tablets (80 mg total) by mouth 2 (two) times daily. 360 tablet 3  . glucose blood (TRUE METRIX BLOOD GLUCOSE TEST) test strip Use as instructed 100 each 12  . lisinopril (PRINIVIL,ZESTRIL) 40 MG tablet Take 40 mg by mouth daily.    . metFORMIN (GLUCOPHAGE) 500 MG tablet Take 1 tablet (500 mg total) by mouth 2 (two) times daily with a meal. 60 tablet 3  . metoprolol (LOPRESSOR) 50 MG tablet Take 1 tablet (50 mg total) by mouth 2 (two) times daily. 60 tablet 11  . nitroGLYCERIN (NITROSTAT) 0.4 MG SL tablet Place 1 tablet (0.4 mg total) under the tongue every 5 (five) minutes as needed for chest pain. 30 tablet 1  . Potassium Chloride ER 20 MEQ TBCR Take 20 mEq by mouth daily. 30 tablet 3  . spironolactone (ALDACTONE) 25 MG tablet Take 1 tablet (25 mg total) by mouth daily. 90 tablet 3  . TRUEPLUS LANCETS 28G MISC 1 each by Does not apply route daily. 30 each 5   No current facility-administered medications for this visit.      Past Medical History  Diagnosis Date  . Hypertension   . Diabetes mellitus (HCC) 09/23/2015  . Hyperlipidemia   . Chronic diastolic (congestive) heart failure (HCC) 11/25/2015  . Influenza with pneumonia 2016  . OSA (obstructive sleep apnea)   . Scoliosis   . Chronic respiratory failure [facility])     Past Surgical History  Procedure Laterality Date  . No previous surgery      Social History   Social History  . Marital Status: Legally Separated    Spouse Name: N/A  . Number of Children: N/A  . Years of Education: N/A   Occupational History  . Not on file.   Social History Main Topics  . Smoking status: Never Smoker   . Smokeless tobacco: Not on file  . Alcohol Use: No  . Drug Use: No  . Sexual Activity: Not on file   Other Topics Concern  . Not on file   Social History Narrative    Family History  Problem Relation Age of Onset  . Diabetes Mother   . Hypertension Mother   . Kidney disease Mother     ROS: no fevers or chills, productive cough, hemoptysis, dysphasia, odynophagia, melena, hematochezia, dysuria, hematuria, rash, seizure activity, orthopnea, PND, pedal edema, claudication. Remaining systems are negative.  Physical Exam: Well-developed well-nourished in no acute distress.  Skin is warm and dry.  HEENT is normal.  Neck is supple.  Chest is clear to auscultation with normal expansion.  Cardiovascular exam is regular rate and rhythm.  Abdominal exam nontender or distended. No masses palpated. Extremities show no edema. neuro grossly intact  ECG    This encounter was created in error - please disregard.

## 2016-05-25 ENCOUNTER — Encounter: Payer: Medicaid Other | Admitting: Cardiology

## 2016-05-25 ENCOUNTER — Encounter: Payer: Self-pay | Admitting: *Deleted

## 2016-07-07 ENCOUNTER — Institutional Professional Consult (permissible substitution): Payer: Medicaid Other | Admitting: Pulmonary Disease

## 2016-08-06 NOTE — Progress Notes (Deleted)
HPI: FU congestive heart failure. Nuclear study January 2017 showed ejection fraction 45%. No ischemia. Echocardiogram April 2017 was technically difficult but showed normal LV function, grade 1 diastolic dysfunction. CTA April 2017 showed no pulmonary embolus. Admitted April 2017 with acute respiratory failure felt secondary to a combination of sleep apnea, obesity hypoventilation syndrome and diastolic congestive heart failure. Patient was diuresed with improvement. Since last seen,   Current Outpatient Prescriptions  Medication Sig Dispense Refill  . amLODipine (NORVASC) 10 MG tablet Take 1 tablet (10 mg total) by mouth daily. 30 tablet 11  . aspirin EC 81 MG tablet Take 1 tablet (81 mg total) by mouth daily. 30 tablet 2  . atorvastatin (LIPITOR) 40 MG tablet Take 1 tablet (40 mg total) by mouth daily. 90 tablet 3  . Blood Glucose Monitoring Suppl (TRUE METRIX METER) DEVI 1 each by Does not apply route daily. 1 Device 0  . furosemide (LASIX) 40 MG tablet Take 2 tablets (80 mg total) by mouth 2 (two) times daily. 360 tablet 3  . glucose blood (TRUE METRIX BLOOD GLUCOSE TEST) test strip Use as instructed 100 each 12  . lisinopril (PRINIVIL,ZESTRIL) 40 MG tablet Take 40 mg by mouth daily.    . metFORMIN (GLUCOPHAGE) 500 MG tablet Take 1 tablet (500 mg total) by mouth 2 (two) times daily with a meal. 60 tablet 3  . metoprolol (LOPRESSOR) 50 MG tablet Take 1 tablet (50 mg total) by mouth 2 (two) times daily. 60 tablet 11  . nitroGLYCERIN (NITROSTAT) 0.4 MG SL tablet Place 1 tablet (0.4 mg total) under the tongue every 5 (five) minutes as needed for chest pain. 30 tablet 1  . Potassium Chloride ER 20 MEQ TBCR Take 20 mEq by mouth daily. 30 tablet 3  . spironolactone (ALDACTONE) 25 MG tablet Take 1 tablet (25 mg total) by mouth daily. 90 tablet 3  . TRUEPLUS LANCETS 28G MISC 1 each by Does not apply route daily. 30 each 5   No current facility-administered medications for this visit.       Past Medical History:  Diagnosis Date  . Chronic diastolic (congestive) heart failure (HCC) 11/25/2015  . Chronic respiratory failure (HCC)   . Diabetes mellitus (HCC) 09/23/2015  . Hyperlipidemia   . Hypertension   . Influenza with pneumonia 2016  . OSA (obstructive sleep apnea)   . Scoliosis     Past Surgical History:  Procedure Laterality Date  . No previous surgery      Social History   Social History  . Marital status: Legally Separated    Spouse name: N/A  . Number of children: N/A  . Years of education: N/A   Occupational History  . Not on file.   Social History Main Topics  . Smoking status: Never Smoker  . Smokeless tobacco: Not on file  . Alcohol use No  . Drug use: No  . Sexual activity: Not on file   Other Topics Concern  . Not on file   Social History Narrative  . No narrative on file    Family History  Problem Relation Age of Onset  . Diabetes Mother   . Hypertension Mother   . Kidney disease Mother     ROS: no fevers or chills, productive cough, hemoptysis, dysphasia, odynophagia, melena, hematochezia, dysuria, hematuria, rash, seizure activity, orthopnea, PND, pedal edema, claudication. Remaining systems are negative.  Physical Exam: Well-developed well-nourished in no acute distress.  Skin is warm and dry.  HEENT  is normal.  Neck is supple.  Chest is clear to auscultation with normal expansion.  Cardiovascular exam is regular rate and rhythm.  Abdominal exam nontender or distended. No masses palpated. Extremities show no edema. neuro grossly intact  ECG

## 2016-08-11 ENCOUNTER — Ambulatory Visit: Payer: Medicaid Other | Admitting: Cardiology

## 2016-08-12 ENCOUNTER — Encounter: Payer: Self-pay | Admitting: *Deleted

## 2016-09-08 ENCOUNTER — Encounter: Payer: Self-pay | Admitting: *Deleted

## 2016-09-20 NOTE — Progress Notes (Deleted)
HPI: FU congestive heart failure. Nuclear study January 2017 showed ejection fraction 45%. No ischemia. Echocardiogram April 2017 was technically difficult but showed normal LV function, grade 1 diastolic dysfunction. CTA April 2017 showed no pulmonary embolus. Admitted April 2017 with acute respiratory failure felt secondary to a combination of sleep apnea, obesity hypoventilation syndrome and diastolic congestive heart failure. Patient was diuresed with improvement. Since last seen,   Current Outpatient Prescriptions  Medication Sig Dispense Refill  . amLODipine (NORVASC) 10 MG tablet Take 1 tablet (10 mg total) by mouth daily. 30 tablet 11  . aspirin EC 81 MG tablet Take 1 tablet (81 mg total) by mouth daily. 30 tablet 2  . atorvastatin (LIPITOR) 40 MG tablet Take 1 tablet (40 mg total) by mouth daily. 90 tablet 3  . Blood Glucose Monitoring Suppl (TRUE METRIX METER) DEVI 1 each by Does not apply route daily. 1 Device 0  . furosemide (LASIX) 40 MG tablet Take 2 tablets (80 mg total) by mouth 2 (two) times daily. 360 tablet 3  . glucose blood (TRUE METRIX BLOOD GLUCOSE TEST) test strip Use as instructed 100 each 12  . lisinopril (PRINIVIL,ZESTRIL) 40 MG tablet Take 40 mg by mouth daily.    . metFORMIN (GLUCOPHAGE) 500 MG tablet Take 1 tablet (500 mg total) by mouth 2 (two) times daily with a meal. 60 tablet 3  . metoprolol (LOPRESSOR) 50 MG tablet Take 1 tablet (50 mg total) by mouth 2 (two) times daily. 60 tablet 11  . nitroGLYCERIN (NITROSTAT) 0.4 MG SL tablet Place 1 tablet (0.4 mg total) under the tongue every 5 (five) minutes as needed for chest pain. 30 tablet 1  . Potassium Chloride ER 20 MEQ TBCR Take 20 mEq by mouth daily. 30 tablet 3  . spironolactone (ALDACTONE) 25 MG tablet Take 1 tablet (25 mg total) by mouth daily. 90 tablet 3  . TRUEPLUS LANCETS 28G MISC 1 each by Does not apply route daily. 30 each 5   No current facility-administered medications for this visit.       Past Medical History:  Diagnosis Date  . Chronic diastolic (congestive) heart failure 11/25/2015  . Chronic respiratory failure (HCC)   . Diabetes mellitus (HCC) 09/23/2015  . Hyperlipidemia   . Hypertension   . Influenza with pneumonia 2016  . OSA (obstructive sleep apnea)   . Scoliosis     Past Surgical History:  Procedure Laterality Date  . No previous surgery      Social History   Social History  . Marital status: Legally Separated    Spouse name: N/A  . Number of children: N/A  . Years of education: N/A   Occupational History  . Not on file.   Social History Main Topics  . Smoking status: Never Smoker  . Smokeless tobacco: Not on file  . Alcohol use No  . Drug use: No  . Sexual activity: Not on file   Other Topics Concern  . Not on file   Social History Narrative  . No narrative on file    Family History  Problem Relation Age of Onset  . Diabetes Mother   . Hypertension Mother   . Kidney disease Mother     ROS: no fevers or chills, productive cough, hemoptysis, dysphasia, odynophagia, melena, hematochezia, dysuria, hematuria, rash, seizure activity, orthopnea, PND, pedal edema, claudication. Remaining systems are negative.  Physical Exam: Well-developed well-nourished in no acute distress.  Skin is warm and dry.  HEENT is  normal.  Neck is supple.  Chest is clear to auscultation with normal expansion.  Cardiovascular exam is regular rate and rhythm.  Abdominal exam nontender or distended. No masses palpated. Extremities show no edema. neuro grossly intact  ECG

## 2016-09-23 ENCOUNTER — Ambulatory Visit: Payer: Medicaid Other | Admitting: Cardiology

## 2016-09-24 ENCOUNTER — Encounter: Payer: Self-pay | Admitting: *Deleted

## 2016-10-29 ENCOUNTER — Encounter: Payer: Self-pay | Admitting: Cardiology

## 2016-12-03 IMAGING — CT CT ANGIO CHEST
2 of 8 series · 18 of 46 positions shown · IV contrast (isovue)
Comparison: Chest x-ray 02/16/2016.  CT chest 09/08/2015

CLINICAL DATA: Hypoxia

EXAM:
CT ANGIOGRAPHY CHEST WITH CONTRAST
TECHNIQUE: Multidetector CT imaging of the chest was performed using the
standard protocol during bolus administration of intravenous
contrast. Multiplanar CT image reconstructions and MIPs were
obtained to evaluate the vascular anatomy.
CONTRAST:  100 mL Isovue 370 IV

[Series 6: coronal mpr · coronal · 0.60mm/px · 3 of 176 slices shown]
[im 44/176  soft-tissue]
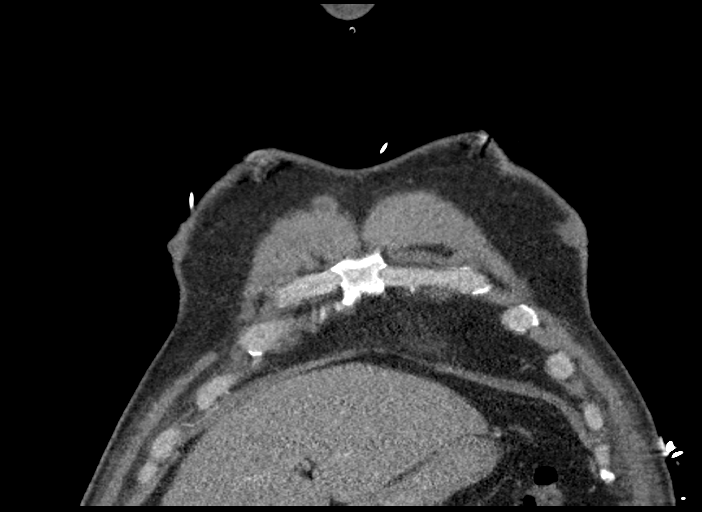
[im 88/176  soft-tissue]
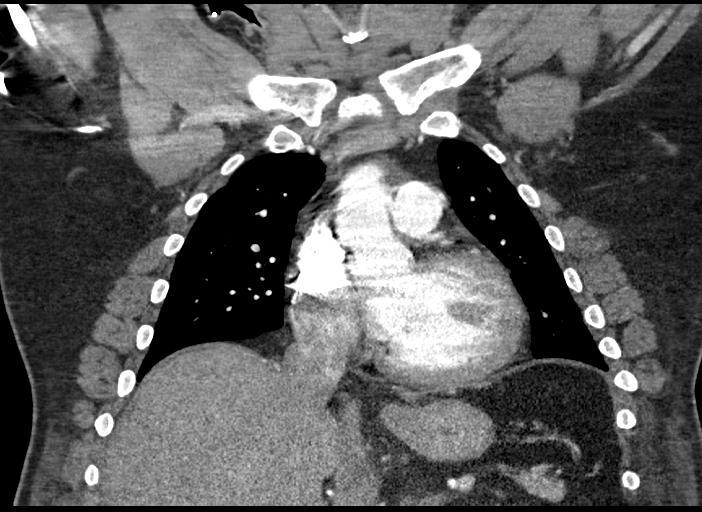
[im 132/176  soft-tissue]
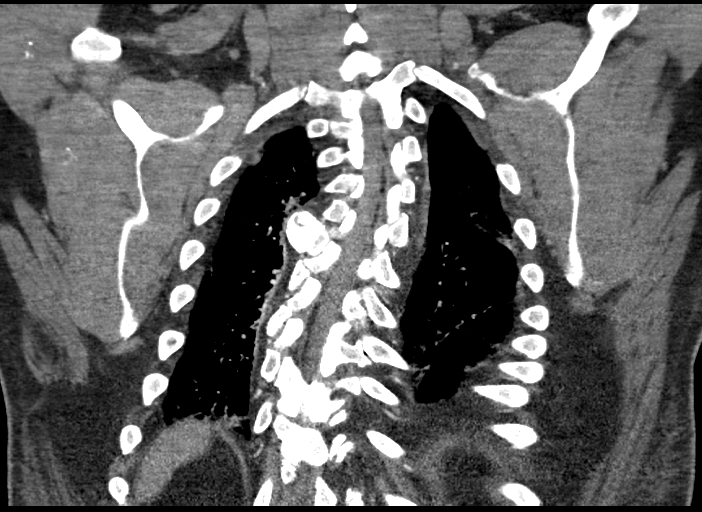

[Series 10: thins · axial · 0.74mm/px · z∈[-295,-21]mm · 15 of 302 slices shown]
[im 14/302  lung]
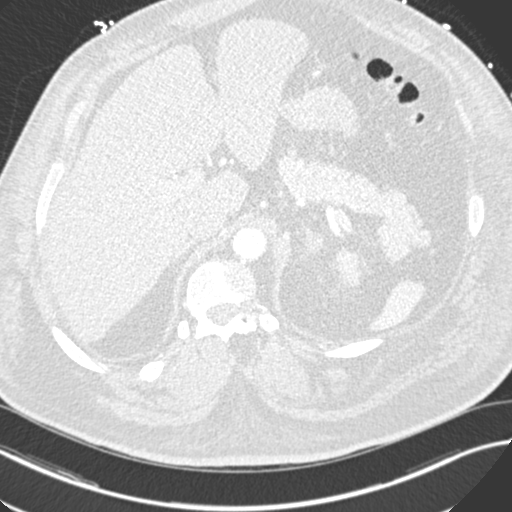
[im 42/302  soft-tissue]
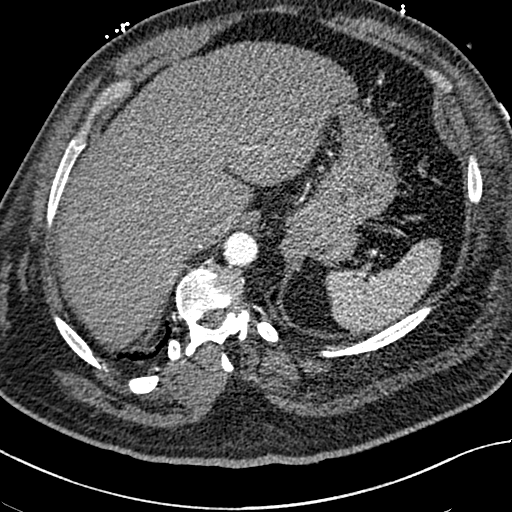
[im 55/302  lung]
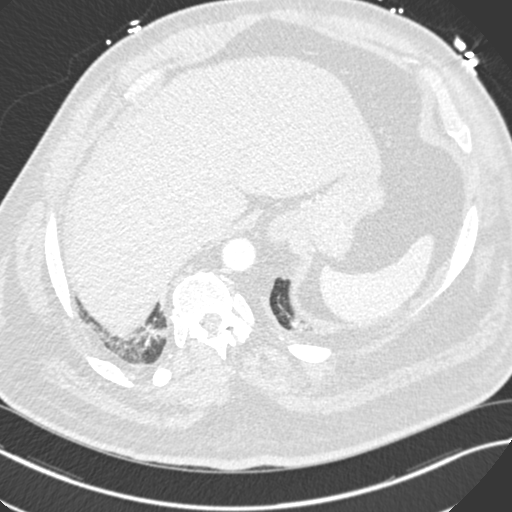
[im 69/302  soft-tissue]
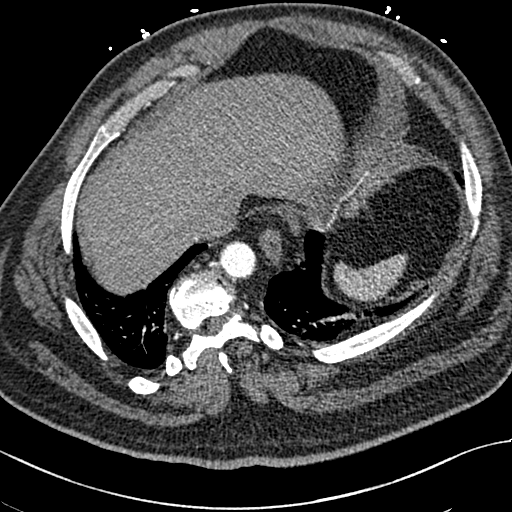
[im 96/302  lung]
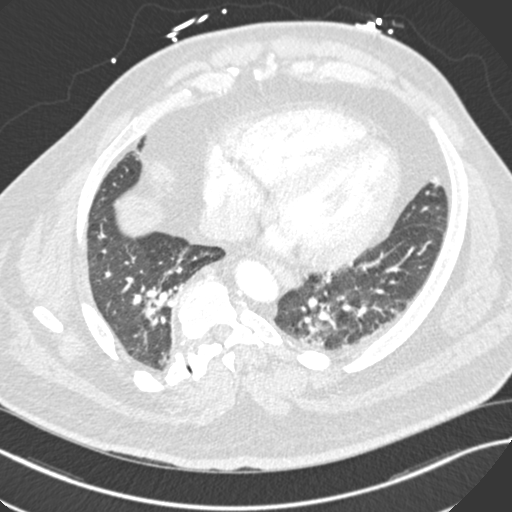
[im 110/302  soft-tissue]
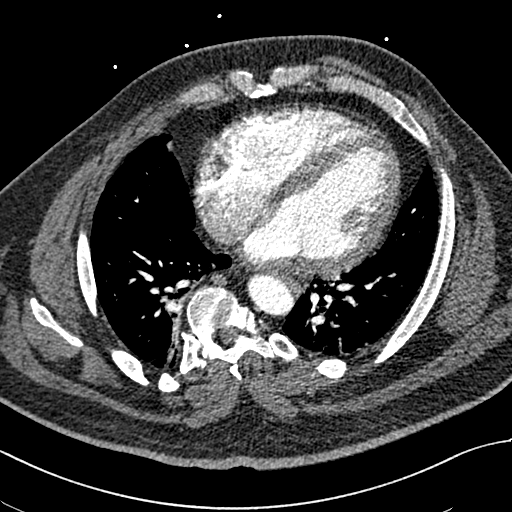
[im 137/302  lung]
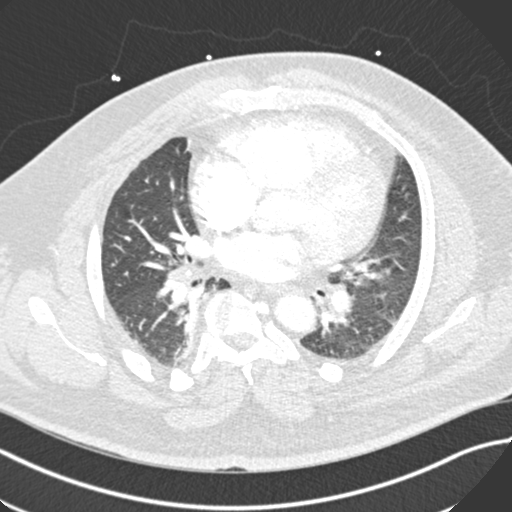
[im 151/302  soft-tissue]
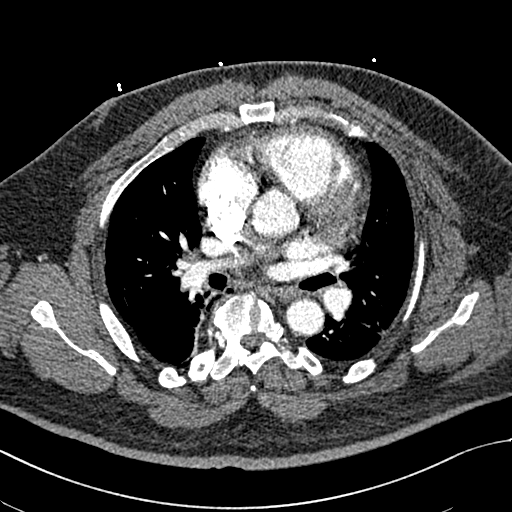
[im 165/302  lung]
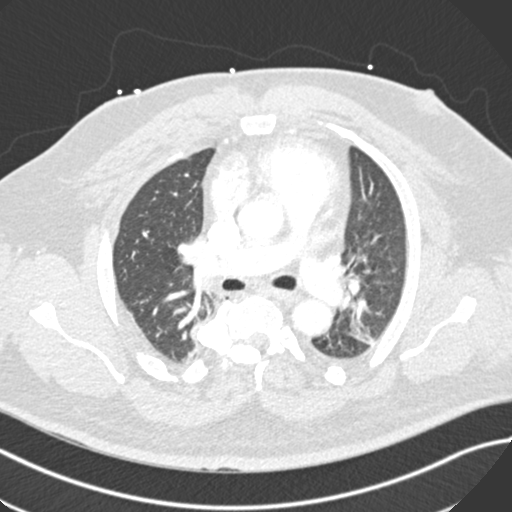
[im 192/302  soft-tissue]
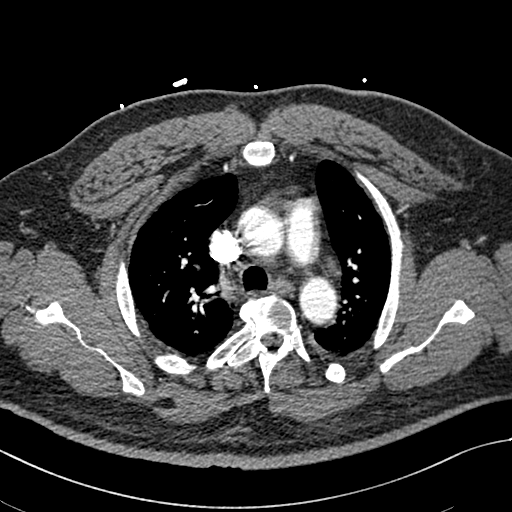
[im 206/302  lung]
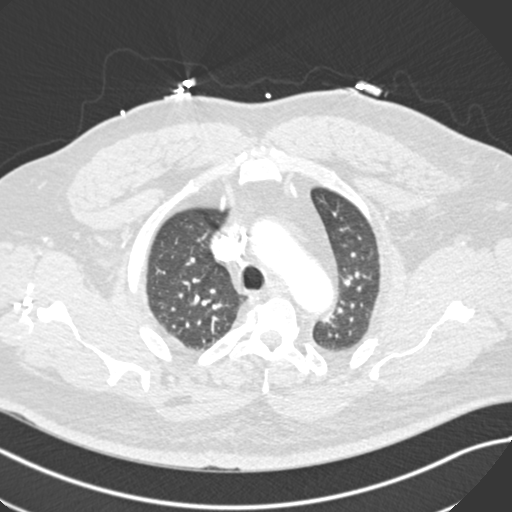
[im 233/302  soft-tissue]
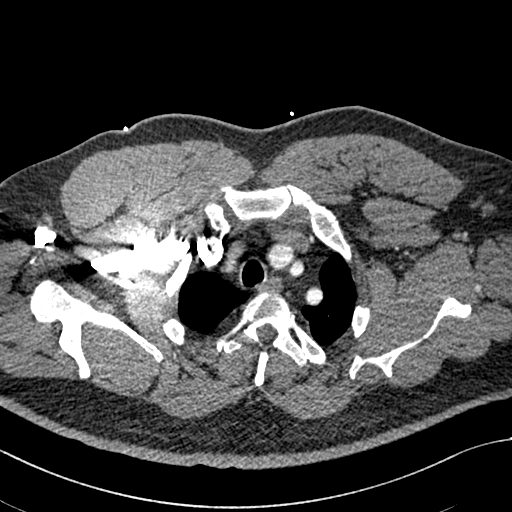
[im 247/302  lung]
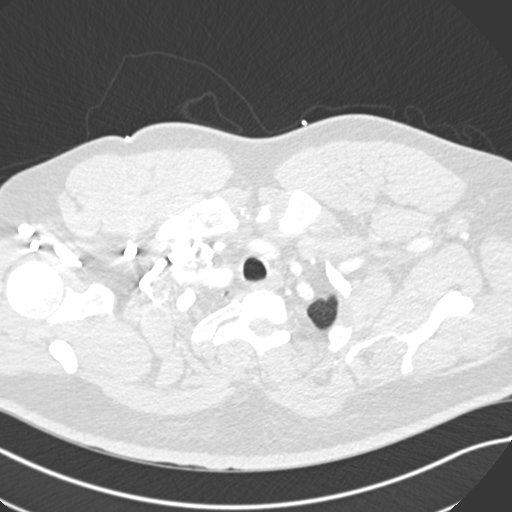
[im 260/302  soft-tissue]
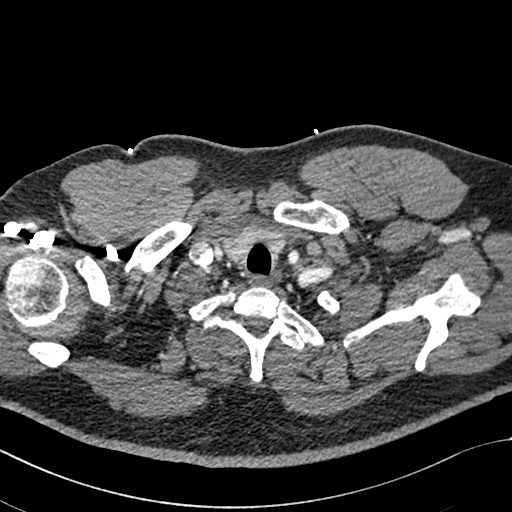
[im 288/302  lung]
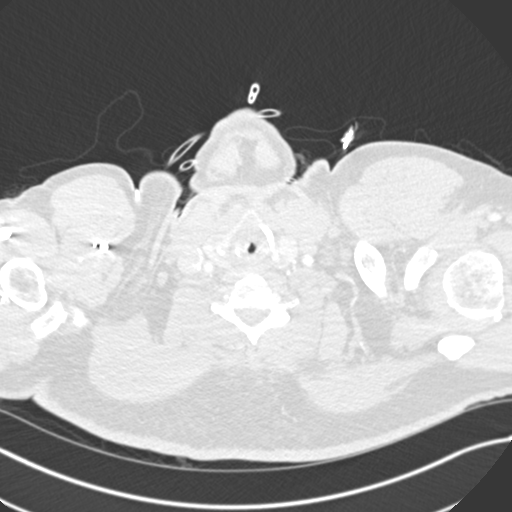

[18 of 46 positions shown; findings below may reference images not displayed]

FINDINGS: Negative for pulmonary embolism. Normal aortic arch. Mild cardiac
enlargement. Coronary calcification in the left coronary artery.

Marked scoliosis in the thoracic spine. Mild atelectasis in lung
bases, improved from 09/08/2015. Negative for pneumonia. No pleural
effusion. Negative for mass or adenopathy.

Upper abdomen negative

Review of the MIP images confirms the above findings.
IMPRESSION: Negative for pulmonary embolism

Mild bibasilar atelectasis.

Moderate scoliosis.

## 2016-12-03 IMAGING — CR DG CHEST 2V
3 series · 3 of 3 positions shown · non-contrast
Comparison: 09/10/2015 and 01/22/2015 and chest CT dated 09/08/2015

CLINICAL DATA: Cough and shortness of breath. Decreased left-sided
breath sounds.

EXAM:
CHEST  2 VIEW

[w chest pa (1 of 2)]
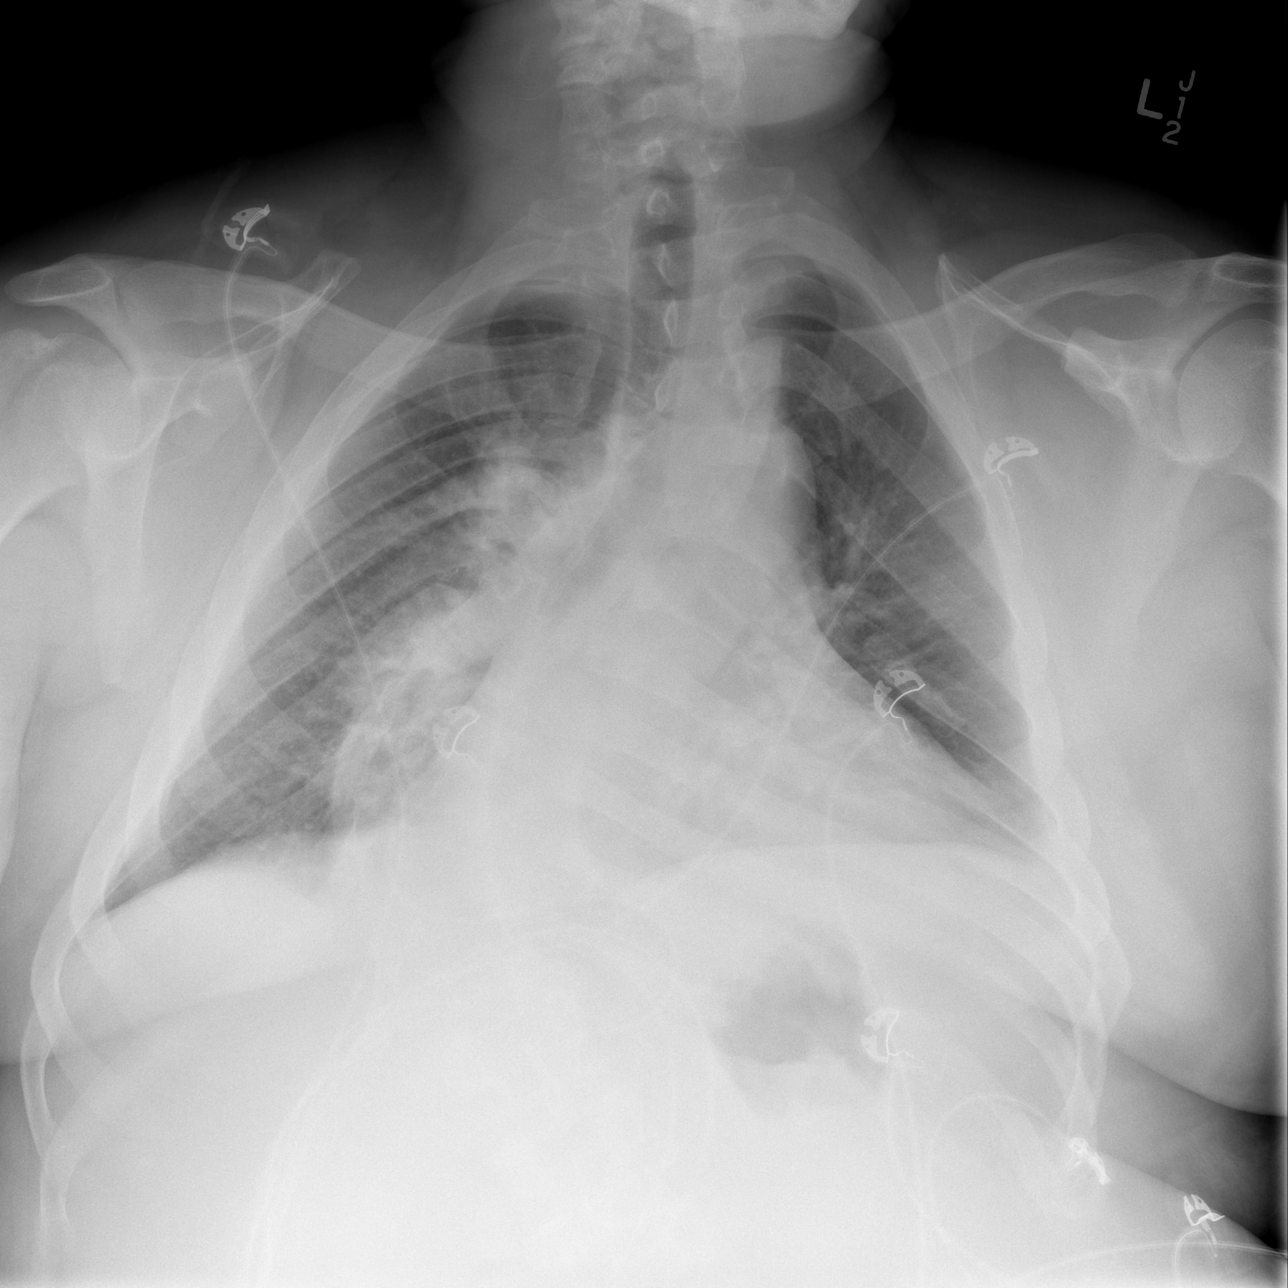

[w chest lat]
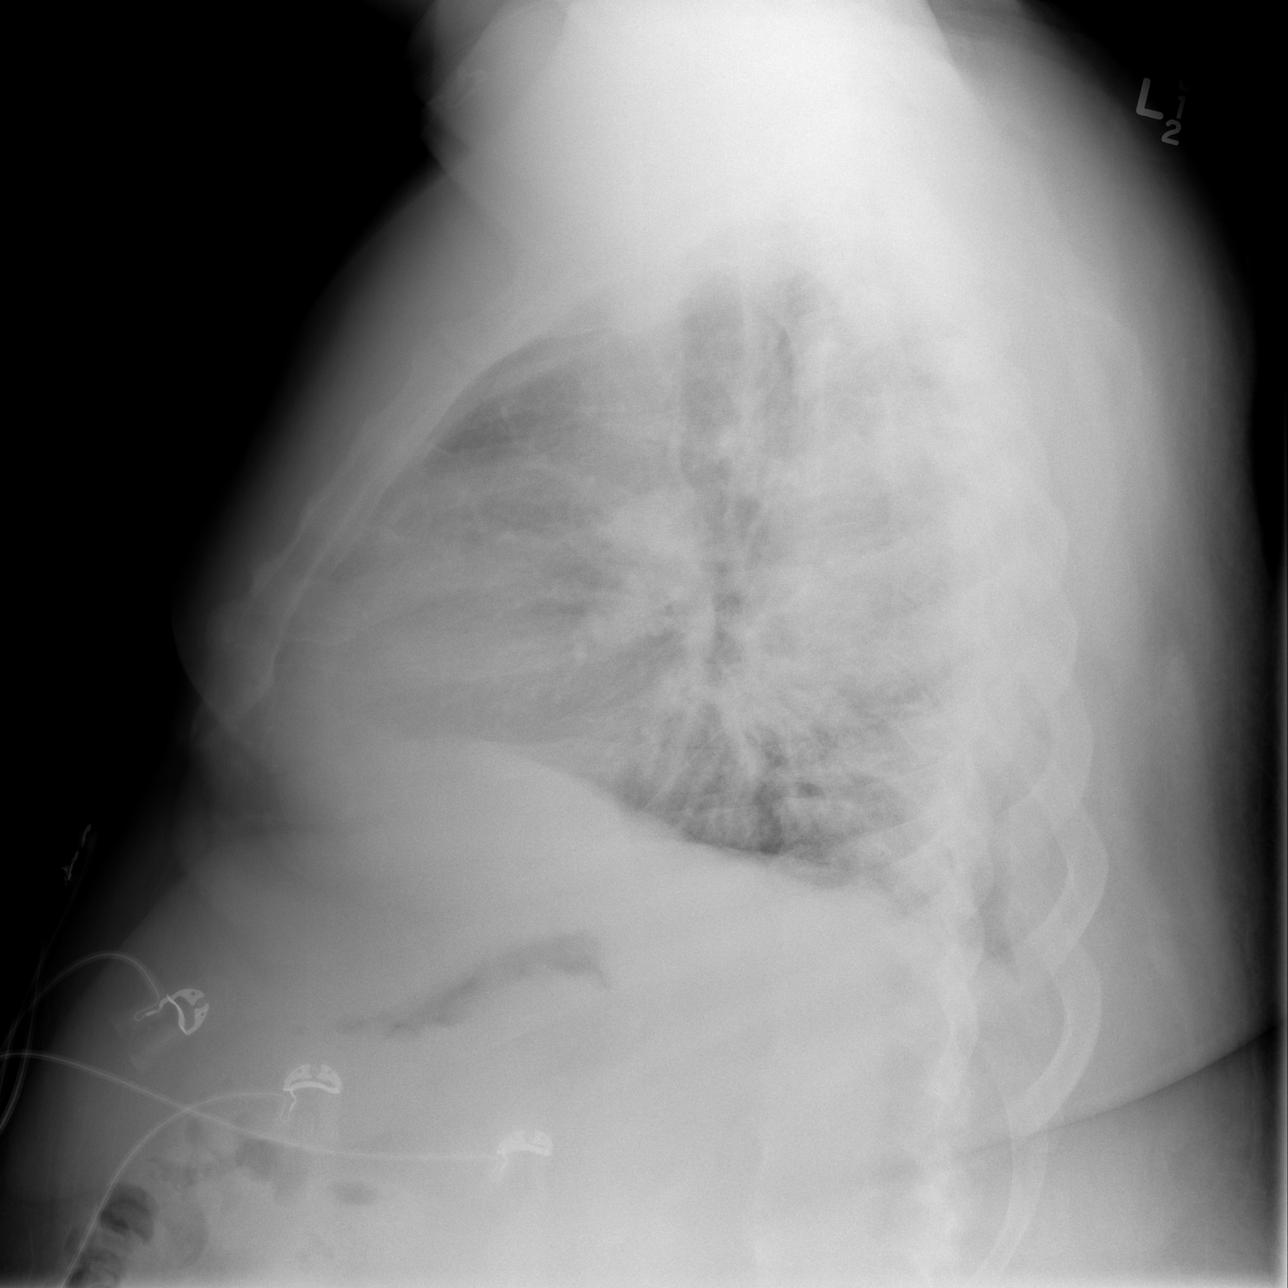

[w chest pa (2 of 2)]
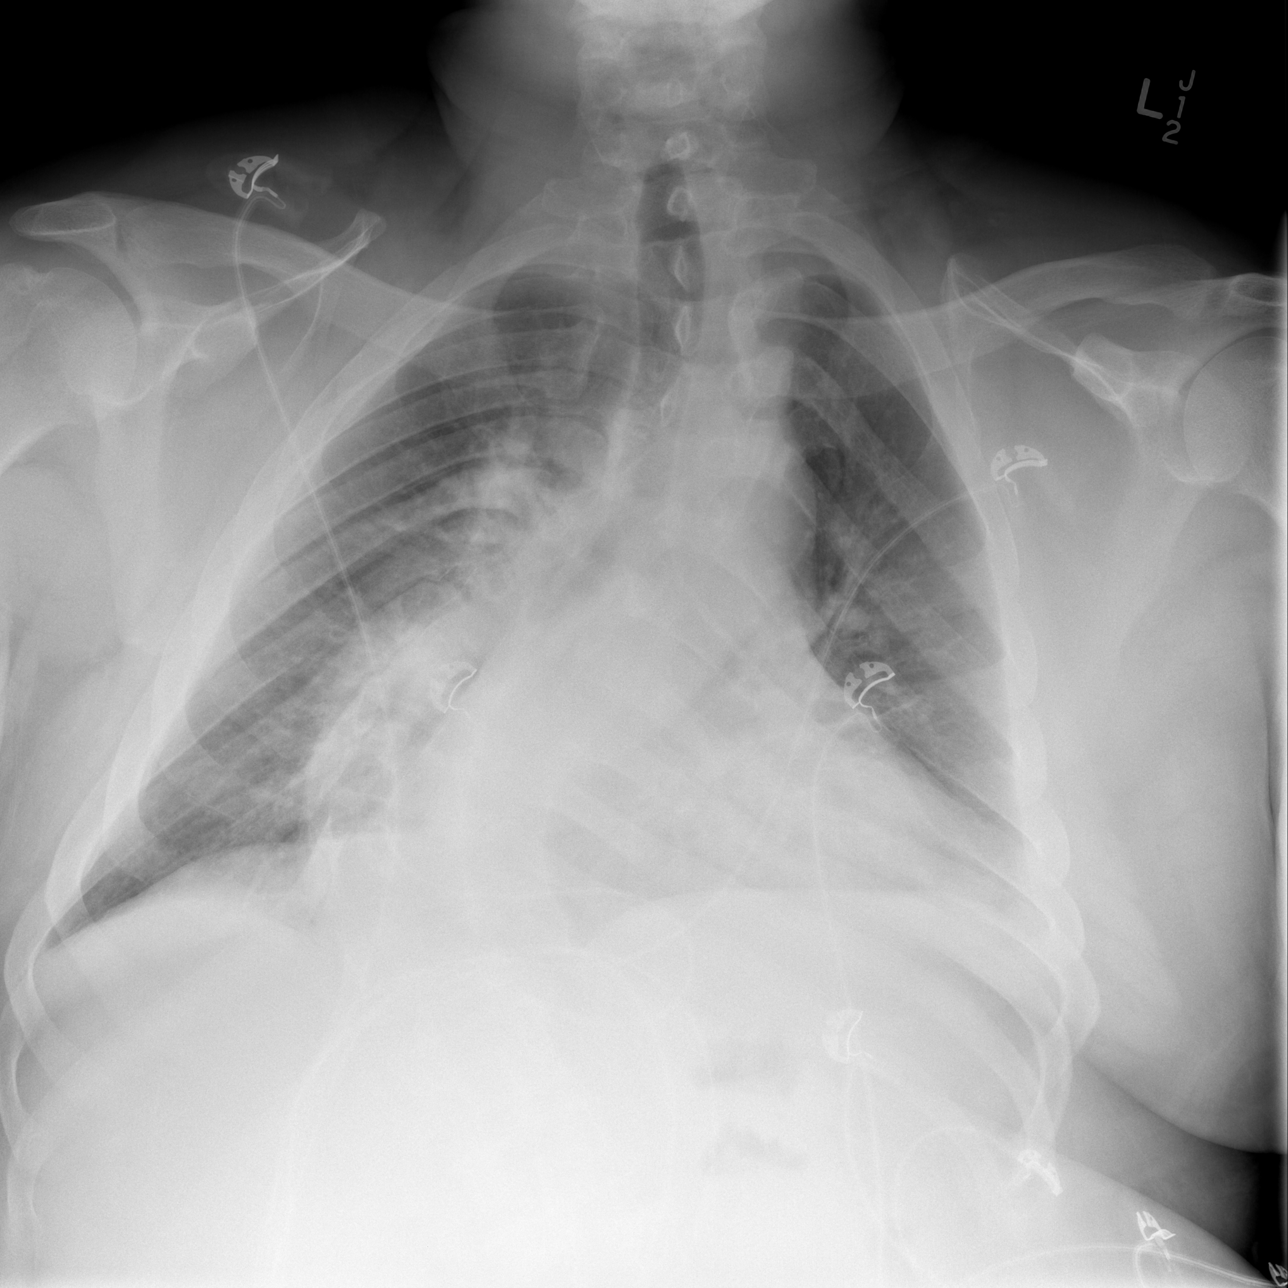

[3 of 3 positions shown; findings below may reference images not displayed]

FINDINGS: Heart size and pulmonary vascularity are normal. The patient has a
severe thoracolumbar scoliosis with chronic compressive atelectasis
at the right lung base medially.

No infiltrates or effusions.  No acute bone abnormality.
IMPRESSION: No active cardiopulmonary disease.

## 2016-12-29 NOTE — Progress Notes (Signed)
No show  Prestyn Mahn PA-C 12/29/2016 7:48 AM

## 2020-03-08 ENCOUNTER — Encounter (HOSPITAL_BASED_OUTPATIENT_CLINIC_OR_DEPARTMENT_OTHER): Payer: Self-pay | Admitting: Emergency Medicine

## 2020-03-08 ENCOUNTER — Other Ambulatory Visit: Payer: Self-pay

## 2020-03-08 ENCOUNTER — Emergency Department (HOSPITAL_BASED_OUTPATIENT_CLINIC_OR_DEPARTMENT_OTHER)
Admission: EM | Admit: 2020-03-08 | Discharge: 2020-03-08 | Disposition: A | Discharge status: Home / Self Care | Payer: Medicaid Other | Attending: Emergency Medicine | Admitting: Emergency Medicine

## 2020-03-08 DIAGNOSIS — I5032 Chronic diastolic (congestive) heart failure: Secondary | ICD-10-CM | POA: Diagnosis not present

## 2020-03-08 DIAGNOSIS — E1140 Type 2 diabetes mellitus with diabetic neuropathy, unspecified: Secondary | ICD-10-CM | POA: Diagnosis not present

## 2020-03-08 DIAGNOSIS — Z79899 Other long term (current) drug therapy: Secondary | ICD-10-CM | POA: Diagnosis not present

## 2020-03-08 DIAGNOSIS — Z7984 Long term (current) use of oral hypoglycemic drugs: Secondary | ICD-10-CM | POA: Insufficient documentation

## 2020-03-08 DIAGNOSIS — I110 Hypertensive heart disease with heart failure: Secondary | ICD-10-CM | POA: Diagnosis not present

## 2020-03-08 DIAGNOSIS — Z7982 Long term (current) use of aspirin: Secondary | ICD-10-CM | POA: Diagnosis not present

## 2020-03-08 DIAGNOSIS — M25512 Pain in left shoulder: Secondary | ICD-10-CM | POA: Diagnosis present

## 2020-03-08 DIAGNOSIS — M19012 Primary osteoarthritis, left shoulder: Secondary | ICD-10-CM | POA: Diagnosis not present

## 2020-03-08 MED ORDER — PREDNISONE 10 MG (21) PO TBPK: ORAL_TABLET | Freq: Every day | ORAL | 0 refills | Status: AC

## 2020-03-08 MED ORDER — CYCLOBENZAPRINE HCL 10 MG PO TABS: 10.0000 mg | ORAL_TABLET | Freq: Two times a day (BID) | ORAL | 0 refills | Status: AC | PRN

## 2020-03-08 MED ORDER — KETOROLAC TROMETHAMINE 15 MG/ML IJ SOLN
15.0000 mg | Freq: Once | INTRAMUSCULAR | Status: AC
  Administered 2020-03-08: 13:00:00 15 mg via INTRAMUSCULAR
  Filled 2020-03-08: qty 1

## 2020-03-08 NOTE — Discharge Instructions (Addendum)
Wear sling for comfort, remove sling for gentle range of motion exercises. Take prednisone as prescribed and complete the full course.  Monitor your blood sugar, discontinue use if your blood sugar is running high. Take Flexeril as needed as directed for muscle tightness. Follow-up with orthopedics, referral given today.

## 2020-03-08 NOTE — ED Triage Notes (Signed)
L shoulder pain x 4 days. States he slept on it.

## 2020-03-08 NOTE — ED Notes (Signed)
ED Provider at bedside. 

## 2020-03-08 NOTE — ED Notes (Signed)
Pt reports sleep apnea, appears tired. PT endorses being driven home by spouse.

## 2020-03-08 NOTE — ED Provider Notes (Signed)
[facility] EMERGENCY DEPARTMENT Provider Note   CSN: [id number] Arrival date & time: 03/08/20  1134     History Chief Complaint  Patient presents with   Shoulder Pain    Shane Hinton is a 59 y.o. male.  59yo male with complaint of pain in his left shoulder for the past 5 days. Patient states pain started when he rolled over in bed 5 days ago, progressively worsening pain and is now unable to move his shoulder secondary to pain. Denies chest pain, shortness of breath, injuries or falls. Patient went to [facility] ED 2 days ago, had xr of shoulder, chest, cardiac work up, found to have arthritis in [facility] joint, negative cardiac work up, given Tylenol and discharged. Patient states they didn't do anything for him. No other complaints or concerns.         Past Medical History:  Diagnosis Date   Chronic diastolic (congestive) heart failure (HCC) 11/25/2015   Chronic respiratory failure (HCC)    Diabetes mellitus (HCC) 09/23/2015   Hyperlipidemia    Hypertension    Influenza with pneumonia 2016   OSA (obstructive sleep apnea)    Scoliosis     Patient Active Problem List   Diagnosis Date Noted   Hypertensive heart disease with heart failure (HCC) 04/07/2016   Tracheostomy status (HCC)    Acute congestive heart failure (HCC)    OSA on CPAP    Acute on chronic respiratory failure with hypercapnia (HCC)    Acute pulmonary edema (HCC)    Hypercarbia 02/16/2016   Diabetic neuropathy (HCC) 12/03/2015   Dyspnea 11/25/2015   Chest pain 11/25/2015   Chronic diastolic (congestive) heart failure (HCC) 11/25/2015   Obesity hypoventilation syndrome (HCC) 10/10/2015   Obstructive sleep apnea 10/10/2015   Back pain, chronic 09/23/2015   Cough 09/23/2015   Dyslipidemia 09/23/2015   PNA (pneumonia) 09/23/2015   Scoliosis 09/23/2015   Apnea, sleep 09/23/2015   Diabetes mellitus (HCC) 09/23/2015   Acute on chronic diastolic heart failure  (HCC) 40/98/1191   AKI (acute kidney injury) (HCC) 09/13/2015   Morbid obesity (HCC) 09/13/2015   Acute encephalopathy 09/13/2015   Acute respiratory failure with hypoxia and hypercapnia (HCC)    Acute respiratory failure (HCC) 09/08/2015   Accumulation of fluid in tissues 01/28/2015   Hypertensive cardiovascular disease 01/28/2015   Hypoxemia 01/23/2015   Lung edema 01/23/2015    Past Surgical History:  Procedure Laterality Date   No previous surgery         Family History  Problem Relation Age of Onset   Diabetes Mother    Hypertension Mother    Kidney disease Mother     Social History   Tobacco Use   Smoking status: Never Smoker   Smokeless tobacco: Never Used  Substance Use Topics   Alcohol use: No   Drug use: No    Home Medications Prior to Admission medications   Medication Sig Start Date End Date Taking? Authorizing Provider  amLODipine (NORVASC) 10 MG tablet Take 1 tablet (10 mg total) by mouth daily. 04/29/16   Leone Brand, NP  aspirin EC 81 MG tablet Take 1 tablet (81 mg total) by mouth daily. 10/31/15   Hoy Register, MD  atorvastatin (LIPITOR) 40 MG tablet Take 1 tablet (40 mg total) by mouth daily. 11/25/15   Lewayne Bunting, MD  Blood Glucose Monitoring Suppl (TRUE METRIX METER) DEVI 1 each by Does not apply route daily. 09/23/15   Hoy Register, MD  cyclobenzaprine (  FLEXERIL) 10 MG tablet Take 1 tablet (10 mg total) by mouth 2 (two) times daily as needed for muscle spasms. 03/08/20   Jeannie Fend, PA-C  FARXIGA 10 MG TABS tablet Take 10 mg by mouth daily. 01/03/20   [provider]  furosemide (LASIX) 40 MG tablet Take 2 tablets (80 mg total) by mouth 2 (two) times daily. 02/24/16   Lewayne Bunting, MD  glucose blood (TRUE METRIX BLOOD GLUCOSE TEST) test strip Use as instructed 09/23/15   Hoy Register, MD  lisinopril (PRINIVIL,ZESTRIL) 40 MG tablet Take 40 mg by mouth daily.    [provider]  metFORMIN  (GLUCOPHAGE) 500 MG tablet Take 1 tablet (500 mg total) by mouth 2 (two) times daily with a meal. 12/03/15   Hoy Register, MD  metoprolol (LOPRESSOR) 50 MG tablet Take 1 tablet (50 mg total) by mouth 2 (two) times daily. 12/05/15   Chilton Si, MD  nitroGLYCERIN (NITROSTAT) 0.4 MG SL tablet Place 1 tablet (0.4 mg total) under the tongue every 5 (five) minutes as needed for chest pain. 10/31/15   Hoy Register, MD  Potassium Chloride ER 20 MEQ TBCR Take 20 mEq by mouth daily. 02/21/16   Rai, Ripudeep K, MD  predniSONE (STERAPRED UNI-PAK 21 TAB) 10 MG (21) TBPK tablet Take by mouth daily. Take 6 tabs by mouth daily  for 2 days, then 5 tabs for 2 days, then 4 tabs for 2 days, then 3 tabs for 2 days, 2 tabs for 2 days, then 1 tab by mouth daily for 2 days 03/08/20   Jeannie Fend, PA-C  spironolactone (ALDACTONE) 25 MG tablet Take 1 tablet (25 mg total) by mouth daily. 02/24/16   Lewayne Bunting, MD  telmisartan (MICARDIS) 80 MG tablet Take 80 mg by mouth daily. 01/02/20   [provider]  TRUEPLUS LANCETS 28G MISC 1 each by Does not apply route daily. 09/23/15   Hoy Register, MD    Allergies    Patient has no known allergies.  Review of Systems   Review of Systems  Constitutional: Negative for fever.  Respiratory: Negative for shortness of breath.   Cardiovascular: Negative for chest pain.  Musculoskeletal: Positive for arthralgias and myalgias.  Skin: Negative for rash and wound.  Neurological: Positive for weakness. Negative for numbness.    Physical Exam Updated Vital Signs BP (!) 161/98 (BP Location: Right Arm)    Pulse 100    Temp 98.9 F (37.2 C) (Oral)    Resp 18    Ht 5\' 8"  (1.727 m)    Wt 114.7 kg    SpO2 97%    BMI 38.45 kg/m   Physical Exam Vitals and nursing note reviewed.  Constitutional:      General: He is not in acute distress.    Appearance: He is well-developed. He is not diaphoretic.  HENT:     Head: Normocephalic and atraumatic.    Cardiovascular:     Rate and Rhythm: Normal rate and regular rhythm.     Pulses: Normal pulses.     Heart sounds: Normal heart sounds.  Pulmonary:     Effort: Pulmonary effort is normal.     Breath sounds: Normal breath sounds.  Abdominal:     Palpations: Abdomen is soft.     Tenderness: There is no abdominal tenderness.  Musculoskeletal:        General: Tenderness present. No deformity.     Left shoulder: Tenderness present. No swelling, deformity or laceration. Decreased range  of motion. Normal pulse.       Arms:     Comments: Will not allow for assessment of ROM secondary to pain. Sensation intact, strong radial pulse, equal grip strength.   Skin:    General: Skin is warm and dry.     Findings: No erythema or rash.  Neurological:     Mental Status: He is alert and oriented to person, place, and time.  Psychiatric:        Behavior: Behavior normal.     ED Results / Procedures / Treatments   Labs (all labs ordered are listed, but only abnormal results are displayed) Labs Reviewed - No data to display  EKG EKG Interpretation  Date/Time:  Saturday Mar 08 2020 11:54:56 EDT Ventricular Rate:  94 PR Interval:    QRS Duration: 95 QT Interval:  355 QTC Calculation: 444 R Axis:   61 Text Interpretation: Sinus rhythm Right atrial enlargement Probable left ventricular hypertrophy No STEMI Confirmed by Alvester Chou [phone]) on 03/08/2020 12:33:14 PM   Radiology No results found.  Procedures Procedures (including critical care time)  Medications Ordered in ED Medications  ketorolac (TORADOL) 15 MG/ML injection 15 mg (has no administration in time range)    ED Course  I have reviewed the triage vital signs and the nursing notes.  Pertinent labs & imaging results that were available during my care of the patient were reviewed by me and considered in my medical decision making (see chart for details).  Clinical Course as of Mar 08 1238  Sat Mar 08, 2020  1230 XR left  shoulder 03/06/20 at [facility] FINDINGS: There is no evidence of fracture or dislocation. Mild acromioclavicular spurring with small inferiorly directed osteophytes. Possible soft tissue calcification adjacent to the humeral head versus overlying artifact, seen only on the axillary view.  IMPRESSION: 1. Mild acromioclavicular degenerative change. 2. Possible soft tissue calcification adjacent to the humeral head versus overlying artifact, seen only on the axillary view.   [LM]  [id number] 59 year old male with complaint of left shoulder pain for the past 5 days after rolling over in bed.  Denies chest pain or shortness of breath, seen at outside ER 2 days ago with cardiac work-up which was unremarkable.  Did have x-ray of shoulder showing degenerative changes.  At this time, patient is tenderness in the anterior left shoulder, allows for minimal exam without grabbing at examiner.  Will not move his shoulder in any direction secondary to severe pain.  He has normal sensation in his hand, equal grip strengths and strong radial pulses.  Patient was placed in a shoulder immobilizer, advised to remove arm from sling periodically for gentle range of motion exercises, given short course of prednisone and advised to monitor his blood sugar, given Flexeril.  Referred to orthopedics for further evaluation and treatment.   [LM]    Clinical Course User Index [LM] Alden Hipp   MDM Rules/Calculators/A&P                      Final Clinical Impression(s) / ED Diagnoses Final diagnoses:  Osteoarthritis of left shoulder, unspecified osteoarthritis type    Rx / DC Orders ED Discharge Orders         Ordered    predniSONE (STERAPRED UNI-PAK 21 TAB) 10 MG (21) TBPK tablet  Daily     03/08/20 1234    cyclobenzaprine (FLEXERIL) 10 MG tablet  2 times daily PRN     03/08/20 1234  Tacy Learn, PA-C 03/08/20 1239    Wyvonnia Dusky, MD 03/08/20 [phone]

## 2020-03-08 NOTE — ED Notes (Signed)
Pt discharged to home. Discharge instructions have been discussed with patient and/or family members. Pt verbally acknowledges understanding d/c instructions, and endorses comprehension to checkout at registration before leaving.  °
